# Patient Record
Sex: Female | Born: 1935 | Race: White | Hispanic: No | Marital: Married | Smoking: Never smoker
Health system: Southern US, Community
[De-identification: ages and names within clinical notes are randomized; demographics above are authoritative.]

## PROBLEM LIST (undated history)

## (undated) DIAGNOSIS — K922 Gastrointestinal hemorrhage, unspecified: Secondary | ICD-10-CM

## (undated) DIAGNOSIS — R011 Cardiac murmur, unspecified: Secondary | ICD-10-CM

## (undated) DIAGNOSIS — F0390 Unspecified dementia without behavioral disturbance: Secondary | ICD-10-CM

## (undated) DIAGNOSIS — J189 Pneumonia, unspecified organism: Secondary | ICD-10-CM

## (undated) DIAGNOSIS — I639 Cerebral infarction, unspecified: Secondary | ICD-10-CM

## (undated) DIAGNOSIS — C801 Malignant (primary) neoplasm, unspecified: Secondary | ICD-10-CM

## (undated) DIAGNOSIS — C50919 Malignant neoplasm of unspecified site of unspecified female breast: Secondary | ICD-10-CM

## (undated) HISTORY — PX: ROTATOR CUFF REPAIR: SHX139

## (undated) HISTORY — PX: MASTECTOMY: SHX3

## (undated) HISTORY — PX: OTHER SURGICAL HISTORY: SHX169

---

## 1998-09-07 ENCOUNTER — Other Ambulatory Visit: Admission: RE | Admit: 1998-09-07 | Discharge: 1998-09-07 | Payer: Self-pay | Admitting: Gynecology

## 1999-01-13 ENCOUNTER — Other Ambulatory Visit: Admission: RE | Admit: 1999-01-13 | Discharge: 1999-01-13 | Payer: Self-pay | Admitting: Gynecology

## 1999-02-06 ENCOUNTER — Emergency Department (HOSPITAL_COMMUNITY): Admission: EM | Admit: 1999-02-06 | Discharge: 1999-02-06 | Payer: Self-pay | Admitting: Emergency Medicine

## 1999-03-01 ENCOUNTER — Encounter: Admission: RE | Admit: 1999-03-01 | Discharge: 1999-03-19 | Payer: Self-pay | Admitting: Surgery

## 1999-09-07 ENCOUNTER — Other Ambulatory Visit: Admission: RE | Admit: 1999-09-07 | Discharge: 1999-09-07 | Payer: Self-pay | Admitting: Gynecology

## 1999-10-18 ENCOUNTER — Encounter: Admission: RE | Admit: 1999-10-18 | Discharge: 1999-10-18 | Payer: Self-pay | Admitting: Gynecology

## 1999-10-18 ENCOUNTER — Encounter: Payer: Self-pay | Admitting: Gynecology

## 1999-10-20 ENCOUNTER — Inpatient Hospital Stay (HOSPITAL_COMMUNITY): Admission: RE | Admit: 1999-10-20 | Discharge: 1999-10-22 | Payer: Self-pay | Admitting: Gynecology

## 1999-10-20 ENCOUNTER — Encounter (INDEPENDENT_AMBULATORY_CARE_PROVIDER_SITE_OTHER): Payer: Self-pay | Admitting: Specialist

## 2000-06-30 ENCOUNTER — Ambulatory Visit (HOSPITAL_COMMUNITY): Admission: RE | Admit: 2000-06-30 | Discharge: 2000-06-30 | Payer: Self-pay | Admitting: *Deleted

## 2000-06-30 ENCOUNTER — Encounter: Payer: Self-pay | Admitting: *Deleted

## 2000-07-17 ENCOUNTER — Ambulatory Visit (HOSPITAL_COMMUNITY): Admission: RE | Admit: 2000-07-17 | Discharge: 2000-07-17 | Payer: Self-pay | Admitting: *Deleted

## 2000-07-17 ENCOUNTER — Encounter: Payer: Self-pay | Admitting: *Deleted

## 2000-08-11 ENCOUNTER — Ambulatory Visit (HOSPITAL_COMMUNITY): Admission: RE | Admit: 2000-08-11 | Discharge: 2000-08-11 | Payer: Self-pay | Admitting: *Deleted

## 2000-09-12 ENCOUNTER — Other Ambulatory Visit: Admission: RE | Admit: 2000-09-12 | Discharge: 2000-09-12 | Payer: Self-pay | Admitting: Gynecology

## 2000-09-25 ENCOUNTER — Encounter: Admission: RE | Admit: 2000-09-25 | Discharge: 2000-09-25 | Payer: Self-pay | Admitting: Surgery

## 2000-09-25 ENCOUNTER — Encounter: Payer: Self-pay | Admitting: Surgery

## 2001-10-01 ENCOUNTER — Encounter: Payer: Self-pay | Admitting: Surgery

## 2001-10-01 ENCOUNTER — Encounter: Admission: RE | Admit: 2001-10-01 | Discharge: 2001-10-01 | Payer: Self-pay | Admitting: Surgery

## 2001-10-02 ENCOUNTER — Other Ambulatory Visit: Admission: RE | Admit: 2001-10-02 | Discharge: 2001-10-02 | Payer: Self-pay | Admitting: Gynecology

## 2001-11-23 ENCOUNTER — Inpatient Hospital Stay (HOSPITAL_COMMUNITY): Admission: AD | Admit: 2001-11-23 | Discharge: 2001-11-26 | Payer: Self-pay | Admitting: Internal Medicine

## 2001-12-11 ENCOUNTER — Encounter: Admission: RE | Admit: 2001-12-11 | Discharge: 2001-12-11 | Payer: Self-pay | Admitting: Internal Medicine

## 2001-12-11 ENCOUNTER — Encounter: Payer: Self-pay | Admitting: Internal Medicine

## 2002-10-03 ENCOUNTER — Encounter: Admission: RE | Admit: 2002-10-03 | Discharge: 2002-10-03 | Payer: Self-pay | Admitting: Surgery

## 2002-10-03 ENCOUNTER — Encounter: Payer: Self-pay | Admitting: Surgery

## 2002-10-07 ENCOUNTER — Other Ambulatory Visit: Admission: RE | Admit: 2002-10-07 | Discharge: 2002-10-07 | Payer: Self-pay

## 2002-11-20 ENCOUNTER — Encounter: Admission: RE | Admit: 2002-11-20 | Discharge: 2002-11-20 | Payer: Self-pay | Admitting: Internal Medicine

## 2002-11-20 ENCOUNTER — Encounter: Payer: Self-pay | Admitting: Internal Medicine

## 2002-11-26 ENCOUNTER — Encounter: Payer: Self-pay | Admitting: Internal Medicine

## 2002-11-26 ENCOUNTER — Encounter: Admission: RE | Admit: 2002-11-26 | Discharge: 2002-11-26 | Payer: Self-pay | Admitting: Internal Medicine

## 2003-12-24 ENCOUNTER — Encounter: Admission: RE | Admit: 2003-12-24 | Discharge: 2003-12-24 | Payer: Self-pay | Admitting: Internal Medicine

## 2004-03-01 ENCOUNTER — Encounter: Admission: RE | Admit: 2004-03-01 | Discharge: 2004-03-01 | Payer: Self-pay | Admitting: Surgery

## 2004-08-26 ENCOUNTER — Ambulatory Visit (HOSPITAL_COMMUNITY): Admission: RE | Admit: 2004-08-26 | Discharge: 2004-08-26 | Payer: Self-pay | Admitting: Gastroenterology

## 2004-10-18 ENCOUNTER — Encounter: Admission: RE | Admit: 2004-10-18 | Discharge: 2004-10-18 | Payer: Self-pay | Admitting: Obstetrics and Gynecology

## 2005-05-17 ENCOUNTER — Encounter: Admission: RE | Admit: 2005-05-17 | Discharge: 2005-05-17 | Payer: Self-pay | Admitting: Internal Medicine

## 2005-08-05 ENCOUNTER — Encounter (INDEPENDENT_AMBULATORY_CARE_PROVIDER_SITE_OTHER): Payer: Self-pay | Admitting: Surgery

## 2005-08-05 ENCOUNTER — Ambulatory Visit (HOSPITAL_BASED_OUTPATIENT_CLINIC_OR_DEPARTMENT_OTHER): Admission: RE | Admit: 2005-08-05 | Discharge: 2005-08-05 | Payer: Self-pay | Admitting: Surgery

## 2005-10-05 ENCOUNTER — Ambulatory Visit (HOSPITAL_COMMUNITY): Admission: RE | Admit: 2005-10-05 | Discharge: 2005-10-05 | Payer: Self-pay | Admitting: Internal Medicine

## 2005-10-17 ENCOUNTER — Ambulatory Visit (HOSPITAL_COMMUNITY): Admission: RE | Admit: 2005-10-17 | Discharge: 2005-10-17 | Payer: Self-pay | Admitting: Internal Medicine

## 2005-12-29 ENCOUNTER — Other Ambulatory Visit: Admission: RE | Admit: 2005-12-29 | Discharge: 2005-12-29 | Payer: Self-pay | Admitting: Obstetrics and Gynecology

## 2006-10-09 ENCOUNTER — Encounter: Admission: RE | Admit: 2006-10-09 | Discharge: 2006-10-09 | Payer: Self-pay | Admitting: Orthopedic Surgery

## 2006-10-10 ENCOUNTER — Ambulatory Visit (HOSPITAL_BASED_OUTPATIENT_CLINIC_OR_DEPARTMENT_OTHER): Admission: RE | Admit: 2006-10-10 | Discharge: 2006-10-11 | Payer: Self-pay | Admitting: Orthopedic Surgery

## 2007-03-16 ENCOUNTER — Encounter: Admission: RE | Admit: 2007-03-16 | Discharge: 2007-03-16 | Payer: Self-pay | Admitting: Internal Medicine

## 2007-07-11 ENCOUNTER — Encounter: Admission: RE | Admit: 2007-07-11 | Discharge: 2007-10-09 | Payer: Self-pay | Admitting: Internal Medicine

## 2007-11-30 ENCOUNTER — Encounter: Admission: RE | Admit: 2007-11-30 | Discharge: 2007-11-30 | Payer: Self-pay | Admitting: Internal Medicine

## 2007-12-05 ENCOUNTER — Encounter: Admission: RE | Admit: 2007-12-05 | Discharge: 2007-12-05 | Payer: Self-pay | Admitting: Internal Medicine

## 2008-01-29 ENCOUNTER — Encounter: Admission: RE | Admit: 2008-01-29 | Discharge: 2008-01-29 | Payer: Self-pay | Admitting: Internal Medicine

## 2008-01-31 ENCOUNTER — Encounter: Admission: RE | Admit: 2008-01-31 | Discharge: 2008-01-31 | Payer: Self-pay | Admitting: Internal Medicine

## 2008-02-06 ENCOUNTER — Ambulatory Visit (HOSPITAL_COMMUNITY): Admission: RE | Admit: 2008-02-06 | Discharge: 2008-02-06 | Payer: Self-pay | Admitting: Internal Medicine

## 2008-04-03 ENCOUNTER — Encounter: Admission: RE | Admit: 2008-04-03 | Discharge: 2008-04-03 | Payer: Self-pay | Admitting: Obstetrics and Gynecology

## 2008-08-25 ENCOUNTER — Encounter: Admission: RE | Admit: 2008-08-25 | Discharge: 2008-08-25 | Payer: Self-pay | Admitting: Internal Medicine

## 2008-08-28 ENCOUNTER — Encounter: Admission: RE | Admit: 2008-08-28 | Discharge: 2008-08-28 | Payer: Self-pay | Admitting: Internal Medicine

## 2010-07-23 ENCOUNTER — Encounter: Admission: RE | Admit: 2010-07-23 | Discharge: 2010-07-23 | Payer: Self-pay | Admitting: Internal Medicine

## 2010-07-27 ENCOUNTER — Encounter: Admission: RE | Admit: 2010-07-27 | Discharge: 2010-07-27 | Payer: Self-pay | Admitting: Internal Medicine

## 2010-07-29 ENCOUNTER — Encounter: Admission: RE | Admit: 2010-07-29 | Discharge: 2010-07-29 | Payer: Self-pay | Admitting: Internal Medicine

## 2010-12-25 ENCOUNTER — Encounter: Payer: Self-pay | Admitting: Internal Medicine

## 2010-12-26 ENCOUNTER — Encounter: Payer: Self-pay | Admitting: Surgery

## 2011-04-22 NOTE — Op Note (Signed)
[name], LADD NO.:  [id number]   MEDICAL RECORD NO.:  [id number]          PATIENT TYPE:  AMB   LOCATION:  DSC                          FACILITY:  [facility]   PHYSICIAN:  Currie Paris, M.D.DATE OF BIRTH:  06/24/1928   DATE OF PROCEDURE:  08/05/2005  DATE OF DISCHARGE:                                 OPERATIVE REPORT   PREOPERATIVE DIAGNOSIS:  Skin lesion of left anterior chest Scarpino, possibly  basal cell carcinoma.   POSTOPERATIVE DIAGNOSIS:  Skin lesion of left anterior chest Buske, possibly  basal cell carcinoma.   OPERATION:  Excision.   SURGEON:  Currie Paris, M.D.   ANESTHESIA:  Local.   CLINICAL HISTORY:  This patient has an enlarging basaloid-looking lesion of  the anterior chest Vancuren that measures about a centimeter.  After discussion  with the patient, she elected to have this removed.   DESCRIPTION OF PROCEDURE:  In the minor procedure room, the patient  confirmed the plans and the lesion was identified.  A time-out occurred.  The area was prepped with some alcohol and then injected with about 8 mL of  1% Xylocaine with epinephrine.  I waited about 10 minutes for the  epinephrine to soak in and then prepped the area Betadine.  The area was  excised with a 3-cm-long incision.  This got it out with what appeared to be  grossly a margin.   The incision was then closed in layers with 3-0 Vicryl followed by 4-0  Monocryl subcuticular and Dermabond.  The patient tolerated her procedure  well.  There were no complications.      Currie Paris, M.D.  Electronically Signed     CJS/MEDQ  D:  08/05/2005  T:  08/06/2005  Job:  [id number]

## 2011-04-22 NOTE — Cardiovascular Report (Signed)
[facility]  Patient:    [name], [name] Visit Number: [id number] MRN: [id number]          Service Type: MED Location: [phone] Attending Physician:  Armanda Magic Dictated by:   Armanda Magic, M.D. Proc. Date: 11/26/01 Admit Date:  11/23/2001 Discharge Date: 11/26/2001   CC:         Theressa Millard, M.D.   Cardiac Catheterization  REFERRING PHYSICIAN: Theressa Millard, M.D.  PROCEDURES PERFORMED: Left heart catheterization, coronary angiography, left ventriculography.  INDICATIONS: Chest pain.  COMPLICATIONS: None.  INTERVENOUS ACCESS: Via right femoral artery, 6 French sheath.  MEDICATIONS GIVEN: Versed 1 mg IV.  HISTORY OF PRESENT ILLNESS: This is a 77 year old, white female with no previous catheter history, who presents with symptoms of unstable angina and now presents for cardiac catheterization.  DESCRIPTION OF PROCEDURE: The patient is brought to the cardiac catheterization laboratory in a fasting, nonsedated state.  Informed consent was obtained.  The patient was connected to continuous heart rate and pulse oximetry monitoring, and intermittent blood pressure monitoring. The right groin was prepped and draped in a sterile fashion.  Xylocaine 1% was used for local anesthesia.  Using the modified Seldinger technique, a 6 French sheath was placed in the right femoral artery.  Under fluoroscopic guidance, a 6 [place] JL4 catheter was placed into the left coronary artery. Multiple cine films were taken in 30 degree RAO, 40 degree LAO views.  This catheter was then exchanged out over a guide wire for a 6 [place] JR4 catheter, which was unsuccessful in engaging the right coronary ostium. This catheter was then exchanged out for a noto right coronary catheter, again, which was unsuccessful in engaging the right coronary artery. This catheter was then exchanged out and an 6 French angled pigtail catheter was placed under fluoroscopic  guidance in the left ventricular cavity. Left ventriculography was performed in a 30 degree RAO view using a total of 24 cc of contrast at 12 cc/sec. The catheter was then pulled back across the aortic valve with no significant pressure gradient. The catheter was then removed over a guide wire and a 6 French 3.5 JR4 was again unsuccessful in engaging the right coronary ostium. This catheter was then exchanged out over a guide wire for a 5 [place] Williams right coronary catheter, which was flushed with the coronary ostium but not fully engaged but was able to completely fill the right coronary artery. Multiple cine films were taken in a 30 degree RAO, 40 degree LAO views. This catheter was then removed over a guide wire. At the end of the procedure, all catheters and sheaths were removed. Manual compression was performed until adequate hemostasis was obtained. The patient was transferred back to the room in stable condition.  IMPRESSION: 1. The left main coronary artery is widely patent and trifurcates into a    left anterior descending artery, ramus branch, and left circumflex artery. 2. The left anterior descending artery is widely patent throughout its    course and gives rise to one diagonal branch which is widely patent. 3. The ramus is a small blood vessel but widely patent. 4. The left circumflex is widely patent throughout its course. 5. The right coronary artery is widely patent throughout its course and    bifurcates distally in the posterior descending artery and posterolateral    artery. 6. Left ventriculography performed in a 30 degree RAO view using a total    of 24 cc of contrast at  12 cc/sec. showed normal LV function and mild    MR.  Aortic pressure 158/73 mmHg, LV pressure 158/19 mmHg.  ASSESSMENT: 1. Noncardiac chest pain. 2. Normal coronary arteries. 3. Normal left ventricular function with mild mitral regurgitation.  PLAN: 1. A 2-D echocardiogram to rule out  effusion. 2. Discharge home later today after IV fluids and bed rest have been    achieved. 3. Questionable GI work-up as an outpatient by Dr. Benjaman Kindler. Dictated by:   Armanda Magic, M.D. Attending Physician:  Armanda Magic DD:  11/26/01 TD:  11/26/01 Job: [id number]

## 2011-04-22 NOTE — Op Note (Signed)
NAMEFINNLEE, GUARNIERI NO.:  [id number]   MEDICAL RECORD NO.:  [id number]          PATIENT TYPE:  AMB   LOCATION:  DSC                          FACILITY:  [facility]   PHYSICIAN:  Katy Fitch. Sypher, M.D. DATE OF BIRTH:  1928/07/19   DATE OF PROCEDURE:  10/10/2006  DATE OF DISCHARGE:                                 OPERATIVE REPORT   PREOPERATIVE DIAGNOSES:  Chronic stage III impingement, right shoulder, with  unfavorable acromial anatomy and chronic retracted rotator cuff tear  involving the supraspinatus rotator cuff tendon.   POSTOPERATIVE DIAGNOSES:  Degenerative fray of labrum and extensive  degenerative tendinopathy of rotator cuff with adhesive capsulitis and  retracted supraspinatus rotator cuff tear.   OPERATION:  1. Diagnostic arthroscopy of right glenohumeral joint, followed by      debridement of granulation tissue and labral debris.  2. Arthroscopic subacromial decompression with relaxation of      coracoacromial ligament and acromioplasty with debridement of rotator      cuff.  3. Open reconstruction of supraspinatus rotator cuff tear and additional      open inferior acromioplasty.   OPERATIONS:  Katy Fitch. Sypher, M.D.   ASSISTANT:  Molly Maduro Dasnoit PA-C.   ANESTHESIA:  General endotracheal without supplemental interscalene block,  supervising anesthesiologist is Dr. Krista Blue.   INDICATIONS:  Rashel Okeefe is a 77 year old right-hand dominant woman, who is  married to a former patient.  Mr. Emrich has had bilateral shoulder surgery.  Mrs. Haslip presented at the suggestion of Dr. Earl Gala for evaluation and  management of her painful right shoulder.  Clinical examination revealed  signs of clinical impingement.  Plain films revealed reactive bone formation  at the greater tuberosity and unfavorable acromial anatomy suggesting  chronic stage III impingement, and an MRI of the shoulder, despite motion  degradation, revealed evidence of a retracted  supraspinatus rotator cuff  tear.   We advised Ms. Ervine to proceed with arthroscopic evaluation of her shoulder  at this time.  We were anticipating repair of the rotator cuff and  subacromial decompression.   PROCEDURE:  Tiani Stanbery was brought to the operating room and placed in  supine position upon the operating table.   Following the induction of general anesthesia by endotracheal technique, Ms.  Nolden was carefully positioned in the beach-chair position with a of a torso  and head holder designed for shoulder arthroscopy.  PAS compression hose  were applied to the lower extremities for deep vein thrombosis prophylaxis  and the entire right upper extremity and forequarter were prepped with  DuraPrep and draped with impervious arthroscopy drapes.  Examination of the  right shoulder under anesthesia revealed mild adhesive capsulitis with  elevation to 160, external rotation of 75, internal rotation of  approximately 60.   The arthroscope was introduced through a standard posterior viewing portal  with blunt technique.  Diagnostic arthroscopy revealed abundant granulation  tissue within the joint and considerable rotator cuff degenerative changes  involving the supraspinatus and anterior infraspinatus.   The labrum was degenerative from approximately 2 o'clock anteriorly to 11  o'clock posteriorly.  The biceps anchor was sound.  The biceps tendon was  partially degenerative but intact through the rotator interval.   An anterior portal was created under direct vision with a blunt trocar,  followed by introduction of a 4.5-mm suction shaver.  The granulation  tissue, adhesive capsulitis, scarring, and degenerative portions of the  labrum and rotator cuff were debrided under direct vision.  Hemostasis was  achieved with the bipolar cautery device.  After thorough debridement of the  joint interior, the scope was removed from the glenohumeral joint and placed  in the subacromial  space.  There was a florid bursitis noted.  After  extensive bursectomy, the anatomy of the coracoacromial arch was studied.   Despite the MRI suggestion of arthritis at the [facility] joint, there was not a  prominent osteophyte at the [facility] joint; therefore, I elected to leave the [facility]  joint intact.  The acromion was leveled to a type 1 morphology except for a  thick osteophyte anterior laterally that was preserved so as to allow proper  reconstruction of the deltoid.  The rotator cuff tear was documented a  digital camera, followed by removal of the arthroscope.  Hemostasis was  achieved with the bipolar cautery.   We proceeded to an open reconstruction of the rotator cuff tear.  A 5 cm  incision was fashioned from the central acromion laterally.  The middle  third of the deltoid was split more posterior to her usual interval to allow  better visualization of the posterior portion of the rotator cuff tear.  After proper bursectomy, the margins of the cuff tear were identified and  the anatomy of the acromial arch studied.  There was a rather prominent  anterior lip with ossification of the coracoacromial ligament.   A rongeur and oscillating saw were used to level the acromion to a type 2  morphology.  Care was taken to preserve the insertion of the coracoacromial  ligament remnant so as to prevent anterior escape.   The rotator cuff was gathered with a grasping suture of #2 FiberWire that  was placed with Merlinda Frederick through-bone tunnels, followed by placement of a  biocorkscrew at the medial footprint.  The profile of the greater tuberosity  was lowered 3 mm and decorticated a distance of 3 cm from anterior to  posterior and approximately 18 mm from lateral to medial.  After placement  of the biocorkscrew anchor, two mattress sutures were placed in the  supraspinatus to allow a good medial footprint.  The McLaughlin sutures were tensioned, advancing the cuff tear laterally,  followed by tying  of both of the biocorkscrew mattress sutures.  The tails  of the mattress sutures were then crisscrossed and placed with push locks  laterally into the humeral cortex with an over-the-top belay technique,  securing and insetting the repair.  A 0 Vicryl was then used to smooth the  repair posteriorly.   A very sensory low-profile well-inset repair was achieved.   The posterior lip of the acromion was leveled with a hand rasp, followed by  irrigation of the subacromial space with sterile saline.  After hemostasis,  the deltoid was repaired anatomically with mattress sutures of #2 FiberWire  using the capsule of the [facility] joint for good anchorage.   An anatomic repair of the deltoid was achieved, followed by repair of the  skin with subdermal sutures of 2-0 Vicryl and intradermal 3-0 Prolene.  The  wound and subacromial space were infiltrated with 0.25% Marcaine and  epinephrine.  There no apparent complications.  Mrs. Larsen tolerated the  surgery and anesthesia well.   She will be transferred to the recovery room and later admitted to the  recovery care center for observation of her vital signs.   During this procedure she was slightly hypertensive.  This was controlled by  the anesthesia staff with appropriate medication.  We had less than 50 mL of  blood loss.  There were no complications noted intraoperatively.      Katy Fitch Sypher, M.D.  Electronically Signed     RVS/MEDQ  D:  10/10/2006  T:  10/10/2006  Job:  [id number]   cc:   Theressa Millard, M.D.

## 2011-04-22 NOTE — Op Note (Signed)
NAMEVERNON, [name] NO.:  [id number]   MEDICAL RECORD NO.:  [id number]          PATIENT TYPE:  AMB   LOCATION:  ENDO                         FACILITY:  [facility]   PHYSICIAN:  Danise Edge, M.D.   DATE OF BIRTH:  1927-12-29   DATE OF PROCEDURE:  08/26/2004  DATE OF DISCHARGE:                                 OPERATIVE REPORT   PROCEDURE:  Colonoscopy.   PROCEDURAL INDICATIONS:  [name] is a 77 year old female born  08/06/1928.  [name] is scheduled to undergo her first screening  colonoscopy with polypectomy to prevent colon cancer.  She has noticed a  change in her bowel movement pattern.  Her stool caliber is decreased and  her stool frequency decreased.  There is no personal or family history of  colon cancer.   ENDOSCOPIST:  Danise Edge, M.D.   PREMEDICATION:  Versed 5 mg, Demerol 50 mg.   PROCEDURE:  After obtaining informed consent, Ms. Cermak was placed in the  left lateral decubitus position.  I administered intravenous Demerol and  intravenous Versed to achieve conscious sedation for the procedure.  The  patient's blood pressure, oxygen saturation, and cardiac rhythm were  monitored throughout the procedure and documented in the medical record.   Anal inspection and digital rectal exam was normal.  The Olympus adjustable  pediatric colonoscope was introduced into the rectum and advanced to the  cecum.  Colonic preparation for the exam today was excellent.   RECTUM:  Normal.   SIGMOID COLON/DESCENDING COLON:  With colonic diverticulosis.   SPLENIC FLEXURE:  Normal.   TRANSVERSE COLON:  Normal.   HEPATIC FLEXURE:  Normal.   ASCENDING COLON:  Normal.   CECUM AND ILEOCECAL VALVE:  Normal.   ASSESSMENT:  Normal screening proctocolonoscopy of the cecum.  Left colonic  diverticulosis noted.  No endoscopic evidence for the presence of colorectal  neoplasia.      MJ/MEDQ  D:  08/26/2004  T:  08/26/2004  Job:  [id number]   cc:    [name], M.D.  [address] [place]  [place] [address]  Fax: [phone]

## 2011-04-22 NOTE — Discharge Summary (Signed)
[facility]  Patient:    [name], [name] Visit Number: [id number] MRN: [id number]          Service Type: MED Location: [phone] Attending Physician:  Armanda Magic Dictated by:   Anselm Lis, N.P. Admit Date:  11/23/2001 Discharge Date: 11/26/2001                             Discharge Summary  PRIMARY CARE Adahlia [place]em[place]:  Theressa Millard, M.D.  PROCEDURES: 1. (November 26, 2001) coronary angiography revealing normal left ventricular    size and function with mild mitral regurgitation.  Normal coronary    arteries. 2. (November 26, 2001) 2-D echocardiogram revealing normal left ventricular    size and function with ejection fraction of 55-65%.  No regional Simonet    motion abnormalities.  Moderate asymmetric septal hypertrophy.  No    pericardial effusion.  DISCHARGE DIAGNOSIS:  Chest pain, noncardiac in etiology; normal left ventricular size and function by 2-D echocardiogram and coronary angiography. Normal coronary arteries by coronary angiogram; questionable gastrointestinal workup in future by Dr. Earl Gala as outpatient.  PLAN: DISPOSITION:  Patient was discharged home in stable condition.  DISCHARGE MEDICATIONS: 1. (New) Nexium 40 mg p.o. q.d. (or Protonix per patients insurance). 2. Vioxx. 3. Vitamins C and E. 4. Miacalcin 1 spray once daily alternating ears.  ACTIVITIES:  No heavy lifting or pushing, or driving day of discharge or the day after discharge, then activity as before.  WOUND CARE:  May shower.  SPECIAL INSTRUCTIONS:  Patient is to call our clinic if she develops a large amount of swelling or bruising in the groin area.  FOLLOW-UP:  Dr. Theressa Millard Monday, January 06th, at 2:45 PM. Dr. Armanda Magic Friday, January 10th, at 3:45 PM.  HISTORY OF PRESENT ILLNESS:  [age]-year-old female admitted with complaints of chest tightness and DOE.  Atypical in presentation.  HOSPITAL COURSE:  1. Initiated on IV heparin  and nitrites.  She ruled out for MI by negative serial cardiac enzymes and nonischemic EKG.  Fairly good lipid profile with HDL 69 and LDL of 140.  On December 23 she went for coronary angiography revealing widely patent coronary arteries and normal left ventricular size and function.  Follow up 2-D echo was negative for pericardial effusion.  She was discharged home with plans for outpatient GI workup by Dr. Earl Gala. Protonix was added to her regimen.  2. Hypokalemia:  Serum potassium 2.2; supplemented and follow up K of 3.6.  LABORATORY TESTS/DATA:  WBC 6, hemoglobin 13.6, hematocrit of 39.1 and platelets of 238,000.  Coags were within normal range.  Sodium 141, K 2.2, which was supplemented, follow up K 3.6, chloride of 106, CO2 28, glucose of 90, BUN of 15, and creatinine 0.8.  LFTs within normal range.  Serial cardiac enzymes times three (CK/MB and troponin I) were negative.  Fasting lipid profile:  cholesterol of 218, triglycerides 47, HDL of 69 and LDL of 140.  PAST MEDICAL HISTORY: 1. Ductal carcinoma in situ of breast:    a. (1999) mastectomy, left. 2. Hysterectomy in 2000 and Dr. Nicholas Lose restarted Estrace in 2000.  This was    discontinued by Dr. Earl Gala, earlier in 2002. 3. Arthritis, left hip; prior CT of the LS spine and bone scan revealed    spondylosis, question spinal stenosis; on Vioxx and Advil. 4. Osteopenia status post bone density scan October 2000; on miacalcin. Dictated by:  Anselm Lis, N.P. Attending Physician:  Armanda Magic DD:  12/31/01 TD:  01/01/02 Job: [id number]

## 2011-07-13 ENCOUNTER — Other Ambulatory Visit: Payer: Self-pay | Admitting: Internal Medicine

## 2011-07-13 ENCOUNTER — Ambulatory Visit
Admission: RE | Admit: 2011-07-13 | Discharge: 2011-07-13 | Disposition: A | Discharge status: Home / Self Care | Payer: Medicare Other | Source: Ambulatory Visit | Attending: Internal Medicine | Admitting: Internal Medicine

## 2011-07-13 DIAGNOSIS — R609 Edema, unspecified: Secondary | ICD-10-CM

## 2011-07-29 ENCOUNTER — Other Ambulatory Visit: Payer: Self-pay | Admitting: Internal Medicine

## 2011-07-29 DIAGNOSIS — Z1231 Encounter for screening mammogram for malignant neoplasm of breast: Secondary | ICD-10-CM

## 2011-08-05 ENCOUNTER — Other Ambulatory Visit: Payer: Self-pay | Admitting: Internal Medicine

## 2011-08-05 ENCOUNTER — Ambulatory Visit
Admission: RE | Admit: 2011-08-05 | Discharge: 2011-08-05 | Disposition: A | Discharge status: Home / Self Care | Payer: Medicare Other | Source: Ambulatory Visit | Attending: Internal Medicine | Admitting: Internal Medicine

## 2011-08-05 DIAGNOSIS — Z1231 Encounter for screening mammogram for malignant neoplasm of breast: Secondary | ICD-10-CM

## 2012-08-23 ENCOUNTER — Other Ambulatory Visit: Payer: Self-pay | Admitting: Internal Medicine

## 2012-08-23 DIAGNOSIS — Z9011 Acquired absence of right breast and nipple: Secondary | ICD-10-CM

## 2012-08-23 DIAGNOSIS — Z1231 Encounter for screening mammogram for malignant neoplasm of breast: Secondary | ICD-10-CM

## 2012-10-05 ENCOUNTER — Ambulatory Visit
Admission: RE | Admit: 2012-10-05 | Discharge: 2012-10-05 | Disposition: A | Discharge status: Home / Self Care | Payer: Medicare Other | Source: Ambulatory Visit | Attending: Internal Medicine | Admitting: Internal Medicine

## 2012-10-05 DIAGNOSIS — Z1231 Encounter for screening mammogram for malignant neoplasm of breast: Secondary | ICD-10-CM

## 2012-10-05 DIAGNOSIS — Z9011 Acquired absence of right breast and nipple: Secondary | ICD-10-CM

## 2013-08-19 ENCOUNTER — Other Ambulatory Visit: Payer: Self-pay

## 2013-08-19 DIAGNOSIS — Z9011 Acquired absence of right breast and nipple: Secondary | ICD-10-CM

## 2013-08-19 DIAGNOSIS — Z853 Personal history of malignant neoplasm of breast: Secondary | ICD-10-CM

## 2013-08-19 DIAGNOSIS — Z1231 Encounter for screening mammogram for malignant neoplasm of breast: Secondary | ICD-10-CM

## 2013-10-08 ENCOUNTER — Ambulatory Visit: Payer: Medicare Other

## 2013-10-21 ENCOUNTER — Ambulatory Visit
Admission: RE | Admit: 2013-10-21 | Discharge: 2013-10-21 | Disposition: A | Discharge status: Home / Self Care | Payer: Medicare Other | Source: Ambulatory Visit

## 2013-10-21 DIAGNOSIS — Z853 Personal history of malignant neoplasm of breast: Secondary | ICD-10-CM

## 2013-10-21 DIAGNOSIS — Z9011 Acquired absence of right breast and nipple: Secondary | ICD-10-CM

## 2013-10-21 DIAGNOSIS — Z1231 Encounter for screening mammogram for malignant neoplasm of breast: Secondary | ICD-10-CM

## 2014-10-09 ENCOUNTER — Other Ambulatory Visit: Payer: Self-pay

## 2014-10-09 DIAGNOSIS — Z9011 Acquired absence of right breast and nipple: Secondary | ICD-10-CM

## 2014-10-09 DIAGNOSIS — Z1231 Encounter for screening mammogram for malignant neoplasm of breast: Secondary | ICD-10-CM

## 2014-11-05 ENCOUNTER — Ambulatory Visit
Admission: RE | Admit: 2014-11-05 | Discharge: 2014-11-05 | Disposition: A | Discharge status: Home / Self Care | Payer: Medicare Other | Source: Ambulatory Visit

## 2014-11-05 ENCOUNTER — Encounter (INDEPENDENT_AMBULATORY_CARE_PROVIDER_SITE_OTHER): Payer: Self-pay

## 2014-11-05 DIAGNOSIS — Z9011 Acquired absence of right breast and nipple: Secondary | ICD-10-CM

## 2014-11-05 DIAGNOSIS — Z1231 Encounter for screening mammogram for malignant neoplasm of breast: Secondary | ICD-10-CM

## 2014-12-19 ENCOUNTER — Other Ambulatory Visit: Payer: Self-pay | Admitting: Nurse Practitioner

## 2014-12-19 ENCOUNTER — Ambulatory Visit
Admission: RE | Admit: 2014-12-19 | Discharge: 2014-12-19 | Disposition: A | Discharge status: Home / Self Care | Payer: Medicare Other | Source: Ambulatory Visit | Attending: Nurse Practitioner | Admitting: Nurse Practitioner

## 2014-12-19 DIAGNOSIS — R0789 Other chest pain: Secondary | ICD-10-CM

## 2015-03-05 ENCOUNTER — Emergency Department
Admit: 2015-03-05 | Discharge: 2015-03-05 | Disposition: A | Discharge status: Home / Self Care | Payer: Self-pay | Admitting: Emergency Medicine

## 2015-03-05 LAB — COMPREHENSIVE METABOLIC PANEL
ANION GAP: 9 (ref 7–16)
AST: 19 U/L
Albumin: 4.2 g/dL
Alkaline Phosphatase: 83 U/L
BUN: 26 mg/dL — AB
Bilirubin,Total: 0.4 mg/dL
CHLORIDE: 102 mmol/L
CO2: 23 mmol/L
CREATININE: 1.35 mg/dL — AB
Calcium, Total: 8.8 mg/dL — ABNORMAL LOW
EGFR (African American): 42 — ABNORMAL LOW
EGFR (Non-African Amer.): 36 — ABNORMAL LOW
Glucose: 121 mg/dL — ABNORMAL HIGH
Potassium: 3.4 mmol/L — ABNORMAL LOW
SGPT (ALT): 13 U/L — ABNORMAL LOW
SODIUM: 134 mmol/L — AB
Total Protein: 8 g/dL

## 2015-03-05 LAB — URINALYSIS, COMPLETE
BILIRUBIN, UR: NEGATIVE
BLOOD: NEGATIVE
Bacteria: NONE SEEN
GLUCOSE, UR: NEGATIVE mg/dL (ref 0–75)
Granular Cast: 3
KETONE: NEGATIVE
LEUKOCYTE ESTERASE: NEGATIVE
Nitrite: NEGATIVE
Ph: 5 (ref 4.5–8.0)
Protein: 30
RBC, UR: NONE SEEN /HPF (ref 0–5)
Specific Gravity: 1.024 (ref 1.003–1.030)
Squamous Epithelial: 1
WBC UR: NONE SEEN /HPF (ref 0–5)

## 2015-03-05 LAB — CBC
HCT: 41.5 % (ref 35.0–47.0)
HGB: 13.8 g/dL (ref 12.0–16.0)
MCH: 29.9 pg (ref 26.0–34.0)
MCHC: 33.2 g/dL (ref 32.0–36.0)
MCV: 90 fL (ref 80–100)
PLATELETS: 153 10*3/uL (ref 150–440)
RBC: 4.61 10*6/uL (ref 3.80–5.20)
RDW: 14 % (ref 11.5–14.5)
WBC: 5.5 10*3/uL (ref 3.6–11.0)

## 2015-03-05 LAB — PROTIME-INR
INR: 1.1
Prothrombin Time: 14 secs

## 2015-03-05 LAB — LIPASE, BLOOD: Lipase: 26 U/L

## 2015-03-05 LAB — APTT: Activated PTT: 33 secs (ref 23.6–35.9)

## 2015-03-05 LAB — CK TOTAL AND CKMB (NOT AT ARMC)
CK, Total: 92 U/L
CK-MB: 2.1 ng/mL

## 2015-03-05 LAB — TROPONIN I: Troponin-I: <0.03 ng/mL

## 2015-03-10 LAB — CULTURE, BLOOD (SINGLE)

## 2015-03-19 LAB — URINALYSIS, COMPLETE
Bilirubin,UR: NEGATIVE
Glucose,UR: NEGATIVE mg/dL (ref 0–75)
KETONE: NEGATIVE
Nitrite: POSITIVE
PH: 5 (ref 4.5–8.0)
Specific Gravity: 1.023 (ref 1.003–1.030)

## 2015-03-19 LAB — TROPONIN I

## 2015-03-19 LAB — CBC
HCT: 42.6 % (ref 35.0–47.0)
HGB: 13.9 g/dL (ref 12.0–16.0)
MCH: 29.8 pg (ref 26.0–34.0)
MCHC: 32.6 g/dL (ref 32.0–36.0)
MCV: 91 fL (ref 80–100)
PLATELETS: 219 10*3/uL (ref 150–440)
RBC: 4.67 10*6/uL (ref 3.80–5.20)
RDW: 13.9 % (ref 11.5–14.5)
WBC: 8.3 10*3/uL (ref 3.6–11.0)

## 2015-03-19 LAB — COMPREHENSIVE METABOLIC PANEL
ALT: 10 U/L — AB
AST: 12 U/L — AB
Albumin: 4.1 g/dL
Alkaline Phosphatase: 87 U/L
Anion Gap: 8 (ref 7–16)
BUN: 24 mg/dL — AB
Bilirubin,Total: 0.5 mg/dL
CHLORIDE: 106 mmol/L
Calcium, Total: 9.4 mg/dL
Co2: 28 mmol/L
Creatinine: 1.12 mg/dL — ABNORMAL HIGH
EGFR (African American): 52 — ABNORMAL LOW
EGFR (Non-African Amer.): 45 — ABNORMAL LOW
GLUCOSE: 129 mg/dL — AB
Potassium: 3.6 mmol/L
SODIUM: 142 mmol/L
TOTAL PROTEIN: 8 g/dL

## 2015-03-20 ENCOUNTER — Observation Stay
Admit: 2015-03-20 | Disposition: A | Discharge status: Skilled Nursing Facility | Payer: Self-pay | Attending: Internal Medicine | Admitting: Internal Medicine

## 2015-03-22 LAB — CBC WITH DIFFERENTIAL/PLATELET
BASOS PCT: 0.7 %
Basophil #: 0 10*3/uL (ref 0.0–0.1)
EOS ABS: 0 10*3/uL (ref 0.0–0.7)
Eosinophil %: 0.2 %
HCT: 36.3 % (ref 35.0–47.0)
HGB: 12.4 g/dL (ref 12.0–16.0)
Lymphocyte #: 3.3 10*3/uL (ref 1.0–3.6)
Lymphocyte %: 47 %
MCH: 30.5 pg (ref 26.0–34.0)
MCHC: 34.1 g/dL (ref 32.0–36.0)
MCV: 89 fL (ref 80–100)
MONO ABS: 1.4 x10 3/mm — AB (ref 0.2–0.9)
Monocyte %: 19.3 %
NEUTROS ABS: 2.3 10*3/uL (ref 1.4–6.5)
Neutrophil %: 32.8 %
Platelet: 186 10*3/uL (ref 150–440)
RBC: 4.07 10*6/uL (ref 3.80–5.20)
RDW: 13.6 % (ref 11.5–14.5)
WBC: 7.1 10*3/uL (ref 3.6–11.0)

## 2015-03-22 LAB — URINE CULTURE

## 2015-04-05 NOTE — Discharge Summary (Addendum)
PATIENT NAME:  [name], [name] MR#:  [id number] DATE OF BIRTH:  11-08-1928  DATE OF ADMISSION:  03/20/2015 DATE OF DISCHARGE:  03/23/2015   DISPOSITION:  [place] on the medical unit.   DISCHARGE DIAGNOSES:  1.  Urinary tract infection with encephalopathy.   2.  Escherichia coli urinary tract infection. 3.  Severe Alzheimer dementia.  4.  Hypertension.  5.  Constipation.    DISCHARGE MEDICATIONS:  Aricept 10 mg p.o. daily,  fluticasone nasal spray 50 mcg once a day, Namenda XR 28 mg daily, trazodone 50 mg daily, cetirizine 10 mg daily, Zoloft 100 mg p.o. daily, metoprolol tartrate 25 mg p.o. b.i.d., Seroquel 200 mg p.o. b.i.d., Omnicef 300 mg q. 12 hours  for 10 days.   CONSULTATIONS:  None.   HOSPITAL COURSE:  An 81 year old female patient with dementia, hypertension who comes in because of more confusion than baseline. Look at history and physical for full details. Patient unable to give any history.  1.  The patient was found to have significant UTI on admission. She was afebrile on admission and UA showed 3+ leukocyte esterase, nitrites, and too numerous WBCs, and bacteria on admission. She was started on IV Rocephin. Urine culture showed Escherichia coli sensitive to cefoxitin, but resistant to Levaquin, Cipro, and ampicillin, so we discharged her with [facility] for 10 days.  2.  Severe Alzheimer dementia (;Continue her medications.  3.  Constipation. Abdominal x-ray showed only constipation, but no obstruction, and she is on senna and Colace.  4.  Depression. Continue Zoloft.  5.  Hypertension. Blood pressure is controlled.  She is on metoprolol 25 mg p.o. b.i.d.  6.  Severe dementia. Continue Namenda and Aricept.   DISCHARGE PHYSICAL EXAMINATION:   VITAL SIGNS: Temperature 98.1, heart rate 65, blood pressure 120/68, saturations 95% on room air.  GENERAL: The patient is severely demented, unable to give any complaints.  CARDIOVASCULAR: S1, S2 regular. LUNGS: Clear to auscultation.   ABDOMEN: Soft, nontender, nondistended. Bowel sounds present.    TIME SPENT ON DISCHARGE PREPARATION: More than 30 minutes.     ____________________________ Epifanio Lesches, MD sk:[id number] D: 03/23/2015 12:43:00 ET T: 03/23/2015 16:45:08 ET JOB#: [id number]  cc: Epifanio Lesches, MD, <Dictator> Epifanio Lesches MD ELECTRONICALLY SIGNED 04/07/2015 13:06

## 2015-04-05 NOTE — H&P (Signed)
PATIENT NAME:  [name], [name] MR#:  [id number] DATE OF BIRTH:  1928-10-10  DATE OF ADMISSION:  03/20/2015  The patient was examined before midnight of 03/20/2015.   REFERRING PHYSICIAN:  Lennette Bihari A. Paduchowski, MD  PRIMARY CARE PRACTITIONER:  Dorian Heckle, MD, of [facility]   CHIEF COMPLAINT:  Found to have increased confusion at assisted living place since the afternoon.   HISTORY OF PRESENT ILLNESS:  This is an 81 year old Caucasian female with a history of dementia, hypertension, mitral valve prolapse, breast cancer status post mastectomy in the past, depression, and seasonal allergies. She was brought in by EMS from the assisted living place with the complaints of being found to have increased confusion since yesterday afternoon. According to the patient's daughters, who are with the patient at this time, the patient was noted to have increased confusion and has been having some gait instability and falls at the assisted living place, hence was brought to the Emergency Room for further evaluation. The patient has advanced dementia and has been a resident of assisted living place for the past many years and is a total care dependent. She usually ambulates with the help of a walker at the assisted living place. Because of her dementia, the patient is not able to give any history. She is alert, awake, and following commands, but not able to participate any communication.   In the Emergency Room, the patient was evaluated by the ED physician and was found to be with stable vitals and afebrile. She had a workup, and blood work was unremarkable for any abnormality. Urinalysis was significant for a significant urinary tract infection. Urine was sent for culture and sensitivity, and the patient was started on IV ceftriaxone. The hospitalist service was consulted for further evaluation and management. The patient at the current time is comfortably resting in the bed, and she states she is doing fine and denies  any pain anywhere.   PAST MEDICAL HISTORY:  According to the patient's daughter:  1.  History of advanced dementia.  2.  Hypertension.  3.  Mitral valve prolapse.  4.  Breast cancer status post right mastectomy in the remote past.  5.  Depression.  6.  Seasonal allergies.   PAST SURGICAL HISTORY: 1.  Right mastectomy.  2.  Hysterectomy.  3.  Bladder tuck surgery.  4.  Right rotator cuff surgery.   ALLERGIES:  SULFA.   SOCIAL HISTORY:  She is married and a resident of [facility]. She walks with the help of a walker. She is total care dependent. Denies any alcohol, tobacco, or substance abuse.   FAMILY HISTORY:  Mother with history of colon cancer and dementia. Father died of a heart attack. One sister with leukemia and breast cancer, another sister with heart disease and pulmonary fibrosis, and a brother with pancreatic cancer and heart disease.   HOME MEDICATIONS:  1.  Cetirizine 10 mg tablet 1 tablet orally once a day as needed.  2.  Donepezil 10 mg 1 tablet orally once a day.  3.  Fluticasone inhalation powder 1 puff inhalation once a day.  4.  Metoprolol tartrate 25 mg 1 tablet orally 2 times a day.  5.  Namenda XR 28 mg oral capsule 1 capsule orally once a day.  6.  Quetiapine 200 mg oral tablet 1 tablet orally 2 times a day as needed for agitation.  7.  Quetiapine 200 mg oral tablet 1 tablet orally 2 times a day.  8.  Sertraline 100  mg tablet 1 tablet orally once a day.  9.  Trazodone 50 mg 1 tablet orally at bedtime.    REVIEW OF SYSTEMS:  Unable to obtain from the patient since the patient has advanced dementia. According to the patient's daughter, there is no history of any fever or chills. She was found to have confusion at the nursing home, hence sent to the Emergency Room for evaluation.   PHYSICAL EXAMINATION: VITAL SIGNS:  Temperature 97.7 degrees Fahrenheit, pulse rate 71 per minute, respirations 18 per minute, blood pressure 125/66, oxygen  saturation 97% on room air.  GENERAL:  Well developed, well nourished. She is awake and alert. No acute distress, comfortably resting in the bed and able to follow commands.  HEAD:  Atraumatic, normocephalic.  EYES:  Pupils are equal and reactive to light and accommodation. No conjunctival pallor. No icterus. Extraocular movements are intact.  NOSE:  No drainage.  EARS:  No drainage.  ORAL CAVITY:  No mucosal lesions. No exudates.  NECK:  Supple. No JVD. No thyromegaly. No carotid bruit. Range of motion of the neck is within normal limits.   RESPIRATORY:  Good respiratory effort. Not using accessory muscles of respiration. Bilateral vesicular breath sounds are present. There are no rales or rhonchi.  CARDIOVASCULAR:  S1 and S2, regular. No murmurs, gallops, or clicks. Pulses are equal at carotid, femoral, and pedal pulses. No peripheral edema.  GASTROINTESTINAL:  Abdomen is soft and nontender. No hepatosplenomegaly. No masses. No rigidity. No guarding. Bowel sounds are present and equal in all 4 quadrants.  GENITOURINARY:  Deferred.  MUSCULOSKELETAL:  No joint tenderness or effusion. Range of motion is adequate. Strength and tone are equal bilaterally.  SKIN:  Inspection is within normal limits.  LYMPHATIC:  No cervical lymphadenopathy.  VASCULAR:  Good dorsalis pedis and posterior tibial pulses.  NEUROLOGICAL:  The patient is awake and alert, not able to communicate because of advanced dementia. Cranial nerves II through XII are grossly intact. No sensory deficit. Motor strength is 5/5 in both upper and lower extremities. DTRs are 2+ bilaterally and symmetrical.  PSYCHIATRIC:  She is awake and alert, not able to communicate. Further examination not possible because of advanced dementia.   ANCILLARY DATA:   LABORATORY DATA:  Serum glucose is 129, BUN 24, creatinine 1.12, sodium 142, potassium 3.6, chloride 106, bicarbonate 25, total calcium 9.4, total protein 8.0, albumin, total bilirubin 0.5,  alkaline phosphatase 87, AST 12, ALT 10. Troponin is less than 0.03. WBC is 8.3, hemoglobin 13.9, hematocrit 42.6, and platelet count is 219,000.   Urinalysis:  Cloudy, nitrite positive, leukocyte esterase 3+, RBCs too numerous to count, WBC too numerous to count, bacteria many.   EKG:  Normal sinus rhythm with ventricular rate of 69 beats per minute. No acute ST-T changes.   ASSESSMENT AND PLAN:  An 81 year old Caucasian female, a resident of assisted living place with history of advanced dementia, hypertension, depression, and seasonal allergies, brought in with the complaints of increased confusion noted at her assisted living place. Workup revealed a significant urinary tract infection.   1.  Urinary tract infection. Plan:  Urine culture and sensitivity, intravenous ceftriaxone, and follow up cultures.  2.  History of dementia, which is advanced, total care dependent. No acute findings. Plan:  Continue supportive care, monitor, and continue home medications.  3.  Hypertension, stable on beta blocker. Continue same. Follow blood pressure recordings.  4.  Depression, stable on home medications. Continue same. 5.  Deep vein thrombosis prophylaxis. Subcutaneous  Lovenox.  6.  Gastrointestinal prophylaxis. Proton pump inhibitor.   CODE STATUS:  Full code.   TIME SPENT:  50 minutes.    ____________________________ Juluis Mire, MD enr:nb D: 03/20/2015 00:48:00 ET T: 03/20/2015 02:33:50 ET JOB#: [id number]  cc: Juluis Mire, MD, <Dictator> Dorian Heckle, MD Juluis Mire MD ELECTRONICALLY SIGNED 03/20/2015 19:23

## 2015-11-06 ENCOUNTER — Emergency Department: Payer: Medicare Other

## 2015-11-06 ENCOUNTER — Encounter: Payer: Self-pay | Admitting: Emergency Medicine

## 2015-11-06 ENCOUNTER — Inpatient Hospital Stay
Admission: EM | Admit: 2015-11-06 | Discharge: 2015-11-13 | DRG: 064 | Disposition: A | Discharge status: Skilled Nursing Facility | Payer: Medicare Other | Attending: Internal Medicine | Admitting: Internal Medicine

## 2015-11-06 DIAGNOSIS — R29810 Facial weakness: Secondary | ICD-10-CM | POA: Diagnosis present

## 2015-11-06 DIAGNOSIS — G309 Alzheimer's disease, unspecified: Secondary | ICD-10-CM | POA: Diagnosis present

## 2015-11-06 DIAGNOSIS — Z9011 Acquired absence of right breast and nipple: Secondary | ICD-10-CM

## 2015-11-06 DIAGNOSIS — I63512 Cerebral infarction due to unspecified occlusion or stenosis of left middle cerebral artery: Secondary | ICD-10-CM | POA: Diagnosis not present

## 2015-11-06 DIAGNOSIS — Y95 Nosocomial condition: Secondary | ICD-10-CM | POA: Diagnosis present

## 2015-11-06 DIAGNOSIS — R0602 Shortness of breath: Secondary | ICD-10-CM

## 2015-11-06 DIAGNOSIS — N3000 Acute cystitis without hematuria: Secondary | ICD-10-CM | POA: Diagnosis present

## 2015-11-06 DIAGNOSIS — R05 Cough: Secondary | ICD-10-CM

## 2015-11-06 DIAGNOSIS — F329 Major depressive disorder, single episode, unspecified: Secondary | ICD-10-CM | POA: Diagnosis present

## 2015-11-06 DIAGNOSIS — K625 Hemorrhage of anus and rectum: Secondary | ICD-10-CM

## 2015-11-06 DIAGNOSIS — R42 Dizziness and giddiness: Secondary | ICD-10-CM

## 2015-11-06 DIAGNOSIS — Z7982 Long term (current) use of aspirin: Secondary | ICD-10-CM

## 2015-11-06 DIAGNOSIS — R059 Cough, unspecified: Secondary | ICD-10-CM

## 2015-11-06 DIAGNOSIS — I951 Orthostatic hypotension: Secondary | ICD-10-CM | POA: Diagnosis present

## 2015-11-06 DIAGNOSIS — Z853 Personal history of malignant neoplasm of breast: Secondary | ICD-10-CM

## 2015-11-06 DIAGNOSIS — R4781 Slurred speech: Secondary | ICD-10-CM | POA: Diagnosis present

## 2015-11-06 DIAGNOSIS — F0150 Vascular dementia without behavioral disturbance: Secondary | ICD-10-CM | POA: Diagnosis present

## 2015-11-06 DIAGNOSIS — K922 Gastrointestinal hemorrhage, unspecified: Secondary | ICD-10-CM | POA: Diagnosis present

## 2015-11-06 DIAGNOSIS — Z66 Do not resuscitate: Secondary | ICD-10-CM | POA: Diagnosis present

## 2015-11-06 DIAGNOSIS — Z79899 Other long term (current) drug therapy: Secondary | ICD-10-CM

## 2015-11-06 DIAGNOSIS — I639 Cerebral infarction, unspecified: Secondary | ICD-10-CM

## 2015-11-06 DIAGNOSIS — G9340 Encephalopathy, unspecified: Secondary | ICD-10-CM

## 2015-11-06 DIAGNOSIS — J189 Pneumonia, unspecified organism: Secondary | ICD-10-CM | POA: Diagnosis present

## 2015-11-06 DIAGNOSIS — Z882 Allergy status to sulfonamides status: Secondary | ICD-10-CM

## 2015-11-06 DIAGNOSIS — F0280 Dementia in other diseases classified elsewhere without behavioral disturbance: Secondary | ICD-10-CM | POA: Diagnosis present

## 2015-11-06 DIAGNOSIS — F0390 Unspecified dementia without behavioral disturbance: Secondary | ICD-10-CM | POA: Diagnosis present

## 2015-11-06 DIAGNOSIS — R918 Other nonspecific abnormal finding of lung field: Secondary | ICD-10-CM

## 2015-11-06 HISTORY — DX: Cardiac murmur, unspecified: R01.1

## 2015-11-06 HISTORY — DX: Malignant (primary) neoplasm, unspecified: C80.1

## 2015-11-06 HISTORY — DX: Malignant neoplasm of unspecified site of unspecified female breast: C50.919

## 2015-11-06 LAB — CBC WITH DIFFERENTIAL/PLATELET
BASOS ABS: 0 10*3/uL (ref 0–0.1)
BASOS PCT: 1 %
Eosinophils Absolute: 0 10*3/uL (ref 0–0.7)
Eosinophils Relative: 0 %
HEMATOCRIT: 33.2 % — AB (ref 35.0–47.0)
HEMOGLOBIN: 10.9 g/dL — AB (ref 12.0–16.0)
Lymphocytes Relative: 41 %
Lymphs Abs: 2.8 10*3/uL (ref 1.0–3.6)
MCH: 28.7 pg (ref 26.0–34.0)
MCHC: 32.9 g/dL (ref 32.0–36.0)
MCV: 87.4 fL (ref 80.0–100.0)
Monocytes Absolute: 1.5 10*3/uL — ABNORMAL HIGH (ref 0.2–0.9)
Monocytes Relative: 23 %
NEUTROS ABS: 2.4 10*3/uL (ref 1.4–6.5)
NEUTROS PCT: 35 %
Platelets: 180 10*3/uL (ref 150–440)
RBC: 3.8 MIL/uL (ref 3.80–5.20)
RDW: 13.5 % (ref 11.5–14.5)
WBC: 6.8 10*3/uL (ref 3.6–11.0)

## 2015-11-06 LAB — URINALYSIS COMPLETE WITH MICROSCOPIC (ARMC ONLY)
BACTERIA UA: NONE SEEN
Bilirubin Urine: NEGATIVE
Glucose, UA: NEGATIVE mg/dL
HGB URINE DIPSTICK: NEGATIVE
Ketones, ur: NEGATIVE mg/dL
Nitrite: NEGATIVE
PH: 5 (ref 5.0–8.0)
PROTEIN: NEGATIVE mg/dL
Specific Gravity, Urine: 1.021 (ref 1.005–1.030)

## 2015-11-06 LAB — COMPREHENSIVE METABOLIC PANEL
ALBUMIN: 3.7 g/dL (ref 3.5–5.0)
ALT: 9 U/L — ABNORMAL LOW (ref 14–54)
AST: 12 U/L — AB (ref 15–41)
Alkaline Phosphatase: 75 U/L (ref 38–126)
Anion gap: 7 (ref 5–15)
BILIRUBIN TOTAL: 0.2 mg/dL — AB (ref 0.3–1.2)
BUN: 14 mg/dL (ref 6–20)
CHLORIDE: 107 mmol/L (ref 101–111)
CO2: 27 mmol/L (ref 22–32)
CREATININE: 0.93 mg/dL (ref 0.44–1.00)
Calcium: 9 mg/dL (ref 8.9–10.3)
GFR calc Af Amer: >60 mL/min (ref >60)
GFR calc non Af Amer: 55 mL/min — ABNORMAL LOW (ref >60)
GLUCOSE: 99 mg/dL (ref 65–99)
POTASSIUM: 3.7 mmol/L (ref 3.5–5.1)
Sodium: 141 mmol/L (ref 135–145)
TOTAL PROTEIN: 6.9 g/dL (ref 6.5–8.1)

## 2015-11-06 LAB — LIPASE, BLOOD: Lipase: 29 U/L (ref 11–51)

## 2015-11-06 LAB — TROPONIN I: TROPONIN I: 0.03 ng/mL (ref <0.031)

## 2015-11-06 LAB — GLUCOSE, CAPILLARY: GLUCOSE-CAPILLARY: 86 mg/dL (ref 65–99)

## 2015-11-06 MED ORDER — ASPIRIN 81 MG PO CHEW
324.0000 mg | CHEWABLE_TABLET | Freq: Once | ORAL | Status: AC
  Administered 2015-11-06: 324 mg via ORAL
  Filled 2015-11-06: qty 4

## 2015-11-06 MED ORDER — SODIUM CHLORIDE 0.9 % IV BOLUS (SEPSIS)
1000.0000 mL | Freq: Once | INTRAVENOUS | Status: AC
  Administered 2015-11-06: 1000 mL via INTRAVENOUS

## 2015-11-06 NOTE — ED Notes (Addendum)
Pt daughters at bedside

## 2015-11-06 NOTE — ED Provider Notes (Signed)
[facility] Emergency Department Provider Note REMINDER - THIS NOTE IS NOT A FINAL MEDICAL RECORD UNTIL IT IS SIGNED. UNTIL THEN, THE CONTENT BELOW MAY REFLECT INFORMATION FROM A DOCUMENTATION TEMPLATE, NOT THE ACTUAL PATIENT VISIT. ____________________________________________  Time seen: Approximately 11:33 PM  I have reviewed the triage vital signs and the nursing notes. EM caveat: History and physical slightly limited by patient's dementia  HISTORY  Chief Complaint Altered Mental Status    HPI [name] is a 81 y.o. female history of dementia, agitation is on a memory care.  Patient's daughter saw her this morning and thought that she looked fairly well, but seems slightly more agitated than frequent. She received to call stroke the day that she been having some agitation and mild confusion, and nursing facility gave her additional doses of Seroquel. Today nursing facility discussed with her daughter that she was having dinner and it seems like she is having difficulty with using copper and they're concerned about a possible stroke. Her last clearly known well time was about 8:30 or 9 AM, and symptoms were noted to be more pronounced around 5:30 PM but there is no clear or obvious time of onset of any acute deficit.  The patient does not complain of any pain. The patient states that she "is just fine".   Past Medical History  Diagnosis Date  . Cancer ([place])   . Heart murmur     There are no active problems to display for this patient.   Past Surgical History  Procedure Laterality Date  . Mastectomy  Rt side    1998  . Bladder tack    . Rotator cuff repair Right     Current Outpatient Rx  Name  Route  Sig  Dispense  Refill  . cetirizine (ZYRTEC) 10 MG tablet   Oral   Take 10 mg by mouth daily as needed for allergies.         Marland Kitchen donepezil (ARICEPT) 10 MG tablet   Oral   Take 10 mg by mouth at bedtime.         . fluticasone (FLONASE) 50  MCG/ACT nasal spray   Each Nare   Place 1 spray into both nostrils daily.         . memantine (NAMENDA XR) 28 MG CP24 24 hr capsule   Oral   Take 28 mg by mouth daily.         . Menthol-Zinc Oxide (RISAMINE) 0.44-20.625 % OINT   Apply externally   Apply 1 application topically as needed (to affected areas).         . metoprolol tartrate (LOPRESSOR) 25 MG tablet   Oral   Take 25 mg by mouth 2 (two) times daily.         . QUEtiapine (SEROQUEL) 200 MG tablet   Oral   Take 200 mg by mouth 2 (two) times daily as needed (for agitation).         . sertraline (ZOLOFT) 100 MG tablet   Oral   Take 100 mg by mouth daily.         . traZODone (DESYREL) 50 MG tablet   Oral   Take 50 mg by mouth at bedtime.           Allergies Sulfa antibiotics  No family history on file.  Social History Social History  Substance Use Topics  . Smoking status: Never Smoker   . Smokeless tobacco: None  . Alcohol Use: No  Review of Systems Some limitation primarily via family Constitutional: No fever/chills Eyes: No visual changes. Gastrointestinal: No abdominal pain.  No nausea, no vomiting.  No diarrhea.  No constipation. Genitourinary: Negative for dysuria. Musculoskeletal: Negative for back pain. Skin: Negative for rash. Neurological: Negative for headaches, patient denies any weakness. Family denies noticing any obvious numbness or weakness or slurred speech or facial droop.  10-point ROS otherwise negative.  ____________________________________________   PHYSICAL EXAM:  VITAL SIGNS: ED Triage Vitals  Enc Vitals Group     BP 11/06/15 2100 124/62 mmHg     Pulse Rate 11/06/15 2102 68     Resp 11/06/15 2045 15     Temp 11/06/15 2102 97.9 F (36.6 C)     Temp Source 11/06/15 2102 Oral     SpO2 11/06/15 2102 97 %     Weight 11/06/15 2102 133 lb 1.6 oz (60.374 kg)     Height 11/06/15 2102 5\' 4"  (1.626 m)     Head Cir --      Peak Flow --      Pain Score --       Pain Loc --      Pain Edu? --      Excl. in [place]? --    Constitutional: Alert but disoriented to situation, oriented to family. Well appearing and in no acute distress. Eyes: Conjunctivae are normal. PERRL. EOMI. Head: Atraumatic. Nose: No congestion/rhinnorhea. Mouth/Throat: Mucous membranes are dry.   Neck: No stridor.   Cardiovascular: Normal rate, regular rhythm. Grossly normal heart sounds.  Good peripheral circulation. Respiratory: Normal respiratory effort.  No retractions. Lungs CTAB. Gastrointestinal: Soft and nontender. No distention. No abdominal bruits. No CVA tenderness. Musculoskeletal: No lower extremity tenderness nor edema.  No joint effusions. Neurologic:  Somewhat limited due to poor participation  The patient has no pronator drift. The patient has normal cranial nerve exam. Extraocular movements are normal. Visual fields are difficult to test, the patient able to read fingers with both eyes. Patient has normal strength in all extremities without any gross deficit noted. There is no numbness or gross, acute sensory abnormality in the extremities bilaterally. No speech disturbance. No dysarthria. No aphasia. No ataxia. Patient speaks in very soft sentences, generally only speaks one to 2 words at a time.   Skin:  Skin is warm, dry and intact. No rash noted. Psychiatric: Mood and affect are normal. Speech and behavior are normal.  ____________________________________________   LABS (all labs ordered are listed, but only abnormal results are displayed)  Labs Reviewed  CBC WITH DIFFERENTIAL/PLATELET - Abnormal; Notable for the following:    Hemoglobin 10.9 (*)    HCT 33.2 (*)    Monocytes Absolute 1.5 (*)    All other components within normal limits  COMPREHENSIVE METABOLIC PANEL - Abnormal; Notable for the following:    AST 12 (*)    ALT 9 (*)    Total Bilirubin 0.2 (*)    GFR calc non Af Amer 55 (*)    All other components within normal limits   URINALYSIS COMPLETEWITH MICROSCOPIC ([facility] ONLY) - Abnormal; Notable for the following:    Color, Urine YELLOW (*)    APPearance CLEAR (*)    Leukocytes, UA 1+ (*)    Squamous Epithelial / LPF 0-5 (*)    All other components within normal limits  LIPASE, BLOOD  TROPONIN I  GLUCOSE, CAPILLARY  CBG MONITORING, ED   ____________________________________________  EKG  ED ECG REPORT I, Mckenzy Salazar, the attending physician,  personally viewed and interpreted this ECG.  Date: 11/06/2015 EKG Time: 2110 Rate: 60 Rhythm: normal sinus rhythm QRS Axis: normal Intervals: normal ST/T Wave abnormalities: normal Conduction Disutrbances: none Narrative Interpretation: unremarkable  ____________________________________________  RADIOLOGY  CT Head Wo Contrast (Final result) Result time: 11/06/15 21:54:37   Final result by Rad Results In Interface (11/06/15 21:54:37)   Narrative:   CLINICAL DATA: Confusion. Altered mental status. History of right breast cancer and right mastectomy.  EXAM: CT HEAD WITHOUT CONTRAST  TECHNIQUE: Contiguous axial images were obtained from the base of the skull through the vertex without intravenous contrast.  COMPARISON: None.  FINDINGS: There is questionable loss of gray-white differentiation in the left frontotemporal region. There is a small low-density focus in the genu of the left internal capsule. No evidence of parenchymal hemorrhage or extra-axial fluid collection. No mass lesion, mass effect, or midline shift.  Intracranial atherosclerosis. Nonspecific subcortical and periventricular white matter hypodensity, most in keeping with chronic small vessel ischemic change.  There is diffuse cerebral volume loss. No ventriculomegaly.  The visualized paranasal sinuses are essentially clear. The mastoid air cells are unopacified. No evidence of calvarial fracture.  IMPRESSION: 1. Questionable loss of gray-white differentiation in the  left frontotemporal region, cannot exclude an acute stroke in this location. If clinically warranted, consider further evaluation with brain MRI. 2. Small low-density focus in the genu of the left internal capsule, likely a remote lacunar infarct. 3. Intracranial atherosclerosis, diffuse cerebral volume loss and chronic small vessel ischemic white matter change.  These results were called by telephone at the time of interpretation on 11/06/2015 at 9:50 pm to Dr. Delman Kitten , who verbally acknowledged these results.   Electronically Signed By: Ilona Sorrel M.D. On: 11/06/2015 21:54          DG Chest Port 1 View (Final result) Result time: 11/06/15 21:24:34   Final result by Rad Results In Interface (11/06/15 21:24:34)   Narrative:   CLINICAL DATA: Altered mental status.  EXAM: PORTABLE CHEST 1 VIEW  COMPARISON: None.  FINDINGS: 2115 hours. There are low lung volumes with patchy left-greater-than-right basilar airspace opacities. The heart size and mediastinal contours are unremarkable for the degree of inspiration. There is mild aortic atherosclerosis. No pneumothorax or significant pleural effusion identified. There are mild degenerative changes at both shoulders.  IMPRESSION: Low lung volumes with patchy bibasilar opacities, possibly atelectasis. Early aspiration cannot be excluded, especially at the left base.    ____________________________________________   PROCEDURES  Procedure(s) performed: None  Critical Care performed: No  ____________________________________________   INITIAL IMPRESSION / ASSESSMENT AND PLAN / ED COURSE  Pertinent labs & imaging results that were available during my care of the patient were reviewed by me and considered in my medical decision making (see chart for details).  Patient presents with what appears to be slightly worsening over a slight mental status, and the differential diagnosis very broad. Somewhat  limited examination, time of onset seems to be slowly and gradual worsening possibly at some point through the late morning to mid-day area there is no clear evidence of a stroke, patient appears to present with what sounds more consistent for like delirium versus toxic metabolic.  Exam nonfocal, head CT is questionable findings which could represent a stroke though again not clearly clinically demonstrated but not impossible. Discussed with neurology Dr. Kingsley Spittle who advises to give aspirin and admitted for MRI. Chest x-ray with questionable opacities, not coughing, no leukocytosis, no fever. Unclear exactly what this represents though  mild aspiration is considered. Continue to follow clinically. ____________________________________________   FINAL CLINICAL IMPRESSION(S) / ED DIAGNOSES  Final diagnoses:  Opacity of lung on imaging study  Encephalopathy      Delman Kitten, MD 11/06/15 2351

## 2015-11-06 NOTE — ED Notes (Signed)
Patient transported to CT 

## 2015-11-06 NOTE — ED Notes (Signed)
XR at bedside

## 2015-11-06 NOTE — ED Notes (Signed)
EKG done at this time has RIGHT AND LEFT ARM REVERSAL -- delete this EKG

## 2015-11-06 NOTE — ED Notes (Signed)
Per EMS staff at [facility] on the memory floor report that patient refused to eat dinner and "dumped out her drink". Per EMS pt was given 2 tablets Seroquel which according to staff "does not touch pt". Per pt's family pt is usually alert and oriented. Pt is alert to self at this time with NAD noted.

## 2015-11-07 ENCOUNTER — Encounter: Payer: Self-pay | Admitting: Internal Medicine

## 2015-11-07 ENCOUNTER — Observation Stay: Payer: Medicare Other

## 2015-11-07 ENCOUNTER — Observation Stay (HOSPITAL_BASED_OUTPATIENT_CLINIC_OR_DEPARTMENT_OTHER)
Admit: 2015-11-07 | Discharge: 2015-11-07 | Disposition: A | Discharge status: Home / Self Care | Payer: Medicare Other | Attending: Internal Medicine | Admitting: Internal Medicine

## 2015-11-07 DIAGNOSIS — G459 Transient cerebral ischemic attack, unspecified: Secondary | ICD-10-CM

## 2015-11-07 DIAGNOSIS — G9340 Encephalopathy, unspecified: Secondary | ICD-10-CM | POA: Diagnosis present

## 2015-11-07 LAB — TSH: TSH: 2.325 u[IU]/mL (ref 0.350–4.500)

## 2015-11-07 MED ORDER — SERTRALINE HCL 50 MG PO TABS
100.0000 mg | ORAL_TABLET | Freq: Every day | ORAL | Status: DC
  Administered 2015-11-07 – 2015-11-13 (×7): 100 mg via ORAL
  Filled 2015-11-07 (×7): qty 2

## 2015-11-07 MED ORDER — LEVOFLOXACIN IN D5W 750 MG/150ML IV SOLN
750.0000 mg | INTRAVENOUS | Status: DC
  Administered 2015-11-07 – 2015-11-09 (×2): 750 mg via INTRAVENOUS
  Filled 2015-11-07 (×3): qty 150

## 2015-11-07 MED ORDER — ACETAMINOPHEN 325 MG PO TABS: 650.0000 mg | ORAL_TABLET | Freq: Four times a day (QID) | ORAL | Status: DC | PRN

## 2015-11-07 MED ORDER — ONDANSETRON HCL 4 MG PO TABS: 4.0000 mg | ORAL_TABLET | Freq: Four times a day (QID) | ORAL | Status: DC | PRN

## 2015-11-07 MED ORDER — ASPIRIN EC 81 MG PO TBEC
81.0000 mg | DELAYED_RELEASE_TABLET | Freq: Every day | ORAL | Status: DC
  Administered 2015-11-07 – 2015-11-08 (×2): 81 mg via ORAL
  Filled 2015-11-07 (×3): qty 1

## 2015-11-07 MED ORDER — METOPROLOL TARTRATE 25 MG PO TABS
25.0000 mg | ORAL_TABLET | Freq: Two times a day (BID) | ORAL | Status: DC
  Administered 2015-11-07 – 2015-11-13 (×9): 25 mg via ORAL
  Filled 2015-11-07 (×10): qty 1

## 2015-11-07 MED ORDER — ZINC OXIDE 20 % EX OINT: TOPICAL_OINTMENT | CUTANEOUS | Status: DC | PRN

## 2015-11-07 MED ORDER — ENSURE ENLIVE PO LIQD
237.0000 mL | Freq: Three times a day (TID) | ORAL | Status: DC
  Administered 2015-11-07 – 2015-11-13 (×15): 237 mL via ORAL

## 2015-11-07 MED ORDER — ONDANSETRON HCL 4 MG/2ML IJ SOLN: 4.0000 mg | Freq: Four times a day (QID) | INTRAMUSCULAR | Status: DC | PRN

## 2015-11-07 MED ORDER — QUETIAPINE FUMARATE 200 MG PO TABS
200.0000 mg | ORAL_TABLET | Freq: Two times a day (BID) | ORAL | Status: DC | PRN
  Administered 2015-11-07 – 2015-11-11 (×3): 200 mg via ORAL
  Filled 2015-11-07: qty 1
  Filled 2015-11-07 (×2): qty 2
  Filled 2015-11-07: qty 1

## 2015-11-07 MED ORDER — ACETAMINOPHEN 650 MG RE SUPP: 650.0000 mg | Freq: Four times a day (QID) | RECTAL | Status: DC | PRN

## 2015-11-07 MED ORDER — SODIUM CHLORIDE 0.9 % IV SOLN
INTRAVENOUS | Status: DC
  Administered 2015-11-07 – 2015-11-12 (×10): via INTRAVENOUS

## 2015-11-07 MED ORDER — MEMANTINE HCL ER 28 MG PO CP24
28.0000 mg | ORAL_CAPSULE | Freq: Every day | ORAL | Status: DC
  Administered 2015-11-07 – 2015-11-13 (×7): 28 mg via ORAL
  Filled 2015-11-07 (×7): qty 1

## 2015-11-07 MED ORDER — MENTHOL-ZINC OXIDE 0.44-20.625 % EX OINT: 1.0000 "application " | TOPICAL_OINTMENT | CUTANEOUS | Status: DC | PRN

## 2015-11-07 MED ORDER — TRAZODONE HCL 50 MG PO TABS
50.0000 mg | ORAL_TABLET | Freq: Every day | ORAL | Status: DC
  Administered 2015-11-07 – 2015-11-11 (×4): 50 mg via ORAL
  Filled 2015-11-07 (×4): qty 1

## 2015-11-07 MED ORDER — DONEPEZIL HCL 5 MG PO TABS
10.0000 mg | ORAL_TABLET | Freq: Every day | ORAL | Status: DC
  Administered 2015-11-07 – 2015-11-11 (×4): 10 mg via ORAL
  Filled 2015-11-07 (×4): qty 2

## 2015-11-07 MED ORDER — PIPERACILLIN-TAZOBACTAM 3.375 G IVPB 30 MIN: 3.3750 g | Freq: Three times a day (TID) | INTRAVENOUS | Status: DC

## 2015-11-07 MED ORDER — LORATADINE 10 MG PO TABS
10.0000 mg | ORAL_TABLET | Freq: Every day | ORAL | Status: DC
Start: 2015-11-07 — End: 2015-11-13
  Administered 2015-11-07 – 2015-11-13 (×7): 10 mg via ORAL
  Filled 2015-11-07 (×7): qty 1

## 2015-11-07 MED ORDER — DOCUSATE SODIUM 100 MG PO CAPS
100.0000 mg | ORAL_CAPSULE | Freq: Two times a day (BID) | ORAL | Status: DC
  Administered 2015-11-07 – 2015-11-13 (×9): 100 mg via ORAL
  Filled 2015-11-07 (×11): qty 1

## 2015-11-07 MED ORDER — PIPERACILLIN-TAZOBACTAM 3.375 G IVPB
3.3750 g | Freq: Three times a day (TID) | INTRAVENOUS | Status: DC
  Administered 2015-11-07 – 2015-11-08 (×3): 3.375 g via INTRAVENOUS
  Filled 2015-11-07 (×5): qty 50

## 2015-11-07 MED ORDER — LORAZEPAM 2 MG/ML IJ SOLN
0.5000 mg | Freq: Once | INTRAMUSCULAR | Status: AC
  Administered 2015-11-07: 0.5 mg via INTRAVENOUS
  Filled 2015-11-07: qty 1

## 2015-11-07 MED ORDER — FLUTICASONE PROPIONATE 50 MCG/ACT NA SUSP
1.0000 | Freq: Every day | NASAL | Status: DC
  Administered 2015-11-07 – 2015-11-13 (×6): 1 via NASAL
  Filled 2015-11-07: qty 16

## 2015-11-07 MED ORDER — STROKE: EARLY STAGES OF RECOVERY BOOK
Freq: Once | Status: AC
  Administered 2015-11-07: 04:00:00

## 2015-11-07 NOTE — Consult Note (Signed)
CC: confusion/disorientation   HPI: [name] is an 81 y.o. female with history of long term dementia.  At baseline she doesn't know date, time, location. There was suspicion that pt was weak on R side with R facial droop.  No focal weakness this AM.  CTH no acute abnormality   Past Medical History  Diagnosis Date  . Cancer ([place])   . Heart murmur   . Breast cancer [facility])     h/o    Past Surgical History  Procedure Laterality Date  . Mastectomy  Rt side    1998  . Bladder tack    . Rotator cuff repair Right     Family History  Problem Relation Age of Onset  . Leukemia Sister   . CAD Father     Social History:  reports that she has never smoked. She does not have any smokeless tobacco history on file. She reports that she does not drink alcohol or use illicit drugs.  Allergies  Allergen Reactions  . Sulfa Antibiotics Hives    Medications: I have reviewed the patient's current medications.  ROS: Not able to obtain due to history of dementia.    Physical Examination: Blood pressure 130/62, pulse 78, temperature 98.2 F (36.8 C), temperature source Oral, resp. rate 20, height 5\' 6"  (1.676 m), weight 133 lb 3.2 oz (60.419 kg), SpO2 100 %.    Neurological Examination Mental Status: Alert to name, agitated  Cranial Nerves: II: Discs flat bilaterally; Visual fields grossly normal, pupils equal, round, reactive to light and accommodation III,IV, VI: ptosis not present, extra-ocular motions intact bilaterally V,VII: smile symmetric, facial light touch sensation normal bilaterally VIII: hearing normal bilaterally IX,X: gag reflex present XI: bilateral shoulder shrug XII: midline tongue extension Motor: Right : Upper extremity   4+/5    Left:     Upper extremity   4+/5  Lower extremity   4+/5     Lower extremity   5/5 Tone and bulk:normal tone throughout; no atrophy noted Sensory: Pinprick and light touch intact throughout, bilaterally Deep Tendon Reflexes: 1+  and symmetric throughout Plantars: Right: downgoing   Left: downgoing Cerebellar: Not tested  Gait: not tested       Laboratory Studies:   Basic Metabolic Panel:  Recent Labs Lab 11/06/15 2124  NA 141  K 3.7  CL 107  CO2 27  GLUCOSE 99  BUN 14  CREATININE 0.93  CALCIUM 9.0    Liver Function Tests:  Recent Labs Lab 11/06/15 2124  AST 12*  ALT 9*  ALKPHOS 75  BILITOT 0.2*  PROT 6.9  ALBUMIN 3.7    Recent Labs Lab 11/06/15 2124  LIPASE 29   No results for input(s): AMMONIA in the last 168 hours.  CBC:  Recent Labs Lab 11/06/15 2124  WBC 6.8  NEUTROABS 2.4  HGB 10.9*  HCT 33.2*  MCV 87.4  PLT 180    Cardiac Enzymes:  Recent Labs Lab 11/06/15 2124  TROPONINI 0.03    BNP: Invalid input(s): POCBNP  CBG:  Recent Labs Lab 11/06/15 2114  GLUCAP 58    Microbiology: Results for orders placed or performed during the hospital encounter of 03/20/15  Urine culture     Status: None   Collection Time: 03/19/15  8:08 PM  Result Value Ref Range Status   Micro Text Report   Final       SOURCE: CLEAN CATCH    ORGANISM 1                >  100,000 CFU/ML Escherichia coli   COMMENT                   WITH MIXED-BACTERIAL-ORGANISMS   ANTIBIOTIC                    ORG#1     AMPICILLIN                    R         CEFAZOLIN                     S         CEFOXITIN                     S         CEFTRIAXONE                   S         CIPROFLOXACIN                 R         ERTAPENEM                     S         GENTAMICIN                    S         IMIPENEM                      S         LEVOFLOXACIN                  R         NITROFURANTOIN                S         Trimethoprim/Sulfamethoxazole S             Coagulation Studies: No results for input(s): LABPROT, INR in the last 72 hours.  Urinalysis:  Recent Labs Lab 11/06/15 2151  COLORURINE YELLOW*  LABSPEC 1.021  PHURINE 5.0  GLUCOSEU NEGATIVE  HGBUR NEGATIVE  BILIRUBINUR  NEGATIVE  KETONESUR NEGATIVE  PROTEINUR NEGATIVE  NITRITE NEGATIVE  LEUKOCYTESUR 1+*    Lipid Panel:  No results found for: CHOL, TRIG, HDL, CHOLHDL, VLDL, LDLCALC  HgbA1C: No results found for: HGBA1C  Urine Drug Screen:  No results found for: LABOPIA, COCAINSCRNUR, LABBENZ, AMPHETMU, THCU, LABBARB  Alcohol Level: No results for input(s): ETH in the last 168 hours.   Ct Head Wo Contrast  11/06/2015  CLINICAL DATA:  Confusion. Altered mental status. History of right breast cancer and right mastectomy. EXAM: CT HEAD WITHOUT CONTRAST TECHNIQUE: Contiguous axial images were obtained from the base of the skull through the vertex without intravenous contrast. COMPARISON:  None. FINDINGS: There is questionable loss of gray-white differentiation in the left frontotemporal region. There is a small low-density focus in the genu of the left internal capsule. No evidence of parenchymal hemorrhage or extra-axial fluid collection. No mass lesion, mass effect, or midline shift. Intracranial atherosclerosis. Nonspecific subcortical and periventricular white matter hypodensity, most in keeping with chronic small vessel ischemic change. There is diffuse cerebral volume loss. No ventriculomegaly. The visualized paranasal sinuses are essentially clear. The mastoid air cells are unopacified. No evidence of calvarial fracture. IMPRESSION: 1. Questionable loss of gray-white  differentiation in the left frontotemporal region, cannot exclude an acute stroke in this location. If clinically warranted, consider further evaluation with brain MRI. 2. Small low-density focus in the genu of the left internal capsule, likely a remote lacunar infarct. 3. Intracranial atherosclerosis, diffuse cerebral volume loss and chronic small vessel ischemic white matter change. These results were called by telephone at the time of interpretation on 11/06/2015 at 9:50 pm to Dr. Delman Kitten , who verbally acknowledged these results. Electronically  Signed   By: Ilona Sorrel M.D.   On: 11/06/2015 21:54   Dg Chest Port 1 View  11/06/2015  CLINICAL DATA:  Altered mental status. EXAM: PORTABLE CHEST 1 VIEW COMPARISON:  None. FINDINGS: 2115 hours. There are low lung volumes with patchy left-greater-than-right basilar airspace opacities. The heart size and mediastinal contours are unremarkable for the degree of inspiration. There is mild aortic atherosclerosis. No pneumothorax or significant pleural effusion identified. There are mild degenerative changes at both shoulders. IMPRESSION: Low lung volumes with patchy bibasilar opacities, possibly atelectasis. Early aspiration cannot be excluded, especially at the left base. Electronically Signed   By: Richardean Sale M.D.   On: 11/06/2015 21:24     Assessment/Plan:  [name] is an 81 y.o. female with history of long term dementia.  At baseline she doesn't know date, time, location. There was suspicion that pt was weak on R side with R facial droop.  No focal weakness this AM.  CTH no acute abnormality   Case d/w with pt's daughter via phone [phone]. Also her cell number is [phone]  Pt at baseline needs full  Assistance with dressing and ambulating.  She is able to feed herself.  At baseline pt would not know date, time, location due to chronic progressive dementia.   Not convinced pt is far off from her baseline.  Still would like to see MRI of head w/out contrast if possible ( pt is very agitated) to  Look for small subcortical ischemia.   Anti platelet therapy.   Likely d/c back to facility tomorrow or early next week.    Leotis Pain   11/07/2015, 9:45 AM     Addendum:  L MCA stroke that is contributing to speech impairment.    Needs pt/ot  Will attempt to call daughter. Leotis Pain

## 2015-11-07 NOTE — Care Management Obs Status (Signed)
[place] [facility] NOTIFICATION   Patient Details  Name: [name] MRN: [id number] Date of Birth: 08-Sep-1928   Medicare Observation Status Notification Given:  Yes (patient with altered mental status no family present/ original left in patient room-copy to HIM scanning)    Ival Bible, RN 11/07/2015, 12:49 PM

## 2015-11-07 NOTE — Progress Notes (Signed)
Alert, very confused. Constantly climbing OOB with bed alarm activating frequently even with staff in room. Pt responds to command, but immediately forgets and starts out of bed. TV and music, repeated education/reminders not to get OOB ineffective. Will move pt to closer room at [facility] when available with Gerald Stabs, charge nurse notified. Picking at IV site with tape/dressing applied for protection. Pt now has been moved to NS in recliner for closer supervision. TC with Traci, dgt and updated with this agreeable. Diversional activities such as activity apron, wash clothes provided with pt not engaged. States she wants to go home/continues impulsive/gets agitated with interactions if pursued.

## 2015-11-07 NOTE — Progress Notes (Signed)
Staffed with Dr. Margaretmary Eddy with pt condition changes and concerns. Awaiting MRI. Speech more slurred,dysarthric, more difficulty following commands, color pale. Will await MRI per MD. Family updated and at bedside with pt. Pt calmer and not trying to climb OOB.

## 2015-11-07 NOTE — Plan of Care (Signed)
Problem: Nutrition: Goal: Risk of aspiration will decrease Outcome: Progressing Pt is alert to self and denies pain. Pt is impulsive. Family was at bedside and later left for the rest of shift. IV fluids running. Pt attempted to remove IV, site is wrapped to protect the IV. No other signs of distress noted. Will continue to monitor.

## 2015-11-07 NOTE — Progress Notes (Signed)
[place] [facility] [place] NAME: [name]    MR#:  [id number]  DATE OF BIRTH:  09-17-1928  SUBJECTIVE:  CHIEF COMPLAINT:  Patient is with altered mental status, intermittent episodes of being agitated. Could not recognize her daughter. Daughter reports patient is still with slurry speech  REVIEW OF SYSTEMS:  Unobtainable because of the altered mental status  DRUG ALLERGIES:   Allergies  Allergen Reactions  . Sulfa Antibiotics Hives    VITALS:  Blood pressure 97/49, pulse 79, temperature 97.5 F (36.4 C), temperature source Oral, resp. rate 20, height 5\' 6"  (1.676 m), weight 60.419 kg (133 lb 3.2 oz), SpO2 98 %.  PHYSICAL EXAMINATION:  GENERAL:  81 y.o.-year-old patient lying in the bed with no acute distress.  EYES: Pupils equal, round, reactive to light and accommodation. No scleral icterus. Extraocular muscles grossly intact.  HEENT: Head atraumatic, normocephalic. Oropharynx and nasopharynx clear.  NECK:  Supple, no jugular venous distention. No thyroid enlargement, no tenderness.  LUNGS: Normal breath sounds bilaterally, no wheezing, rales,rhonchi or crepitation. No use of accessory muscles of respiration.  CARDIOVASCULAR: S1, S2 normal. No murmurs, rubs, or gallops.  ABDOMEN: Soft, nontender, nondistended. Bowel sounds present. No organomegaly or mass.  EXTREMITIES: No pedal edema, cyanosis, or clubbing.  NEUROLOGIC: Cranial nerves II through XII are grossly intact. Muscle strength 5/5 in all extremities. Sensation intact. Gait not checked. Dysarthric speech PSYCHIATRIC: The patient is alert and disoriented SKIN: No obvious rash, lesion, or ulcer.    LABORATORY PANEL:   CBC  Recent Labs Lab 11/06/15 2124  WBC 6.8  HGB 10.9*  HCT 33.2*  PLT 180   ------------------------------------------------------------------------------------------------------------------  Chemistries   Recent Labs Lab 11/06/15 2124  NA  141  K 3.7  CL 107  CO2 27  GLUCOSE 99  BUN 14  CREATININE 0.93  CALCIUM 9.0  AST 12*  ALT 9*  ALKPHOS 75  BILITOT 0.2*   ------------------------------------------------------------------------------------------------------------------  Cardiac Enzymes  Recent Labs Lab 11/06/15 2124  TROPONINI 0.03   ------------------------------------------------------------------------------------------------------------------  RADIOLOGY:  Ct Head Wo Contrast  11/06/2015  CLINICAL DATA:  Confusion. Altered mental status. History of right breast cancer and right mastectomy. EXAM: CT HEAD WITHOUT CONTRAST TECHNIQUE: Contiguous axial images were obtained from the base of the skull through the vertex without intravenous contrast. COMPARISON:  None. FINDINGS: There is questionable loss of gray-white differentiation in the left frontotemporal region. There is a small low-density focus in the genu of the left internal capsule. No evidence of parenchymal hemorrhage or extra-axial fluid collection. No mass lesion, mass effect, or midline shift. Intracranial atherosclerosis. Nonspecific subcortical and periventricular white matter hypodensity, most in keeping with chronic small vessel ischemic change. There is diffuse cerebral volume loss. No ventriculomegaly. The visualized paranasal sinuses are essentially clear. The mastoid air cells are unopacified. No evidence of calvarial fracture. IMPRESSION: 1. Questionable loss of gray-white differentiation in the left frontotemporal region, cannot exclude an acute stroke in this location. If clinically warranted, consider further evaluation with brain MRI. 2. Small low-density focus in the genu of the left internal capsule, likely a remote lacunar infarct. 3. Intracranial atherosclerosis, diffuse cerebral volume loss and chronic small vessel ischemic white matter change. These results were called by telephone at the time of interpretation on 11/06/2015 at 9:50 pm to Dr.  Delman Kitten , who verbally acknowledged these results. Electronically Signed   By: Ilona Sorrel M.D.   On: 11/06/2015 21:54   Dg Chest Port 1  View  11/06/2015  CLINICAL DATA:  Altered mental status. EXAM: PORTABLE CHEST 1 VIEW COMPARISON:  None. FINDINGS: 2115 hours. There are low lung volumes with patchy left-greater-than-right basilar airspace opacities. The heart size and mediastinal contours are unremarkable for the degree of inspiration. There is mild aortic atherosclerosis. No pneumothorax or significant pleural effusion identified. There are mild degenerative changes at both shoulders. IMPRESSION: Low lung volumes with patchy bibasilar opacities, possibly atelectasis. Early aspiration cannot be excluded, especially at the left base. Electronically Signed   By: Richardean Sale M.D.   On: 11/06/2015 21:24    EKG:   Orders placed or performed during the hospital encounter of 11/06/15  . ED EKG  . ED EKG    ASSESSMENT AND PLAN:   This is an 81 year old Caucasian female admitted for encephalopathy and possible stroke.  #. Encephalopathy: Could be from CVA/acute cystitis/pneumonia Patient is altered, has baseline Alzheimer's dementia  #. CVA: Possible;  CT head is nonconclusive  We will get MRA of the brain, carotid Dopplers and 2-D echocardiogram  Get neuro checks  Neurology consult is placed Will ask RN to do bedside swallow evaluation, if not done so far Check fasting lipid panel and start patient on statin Continue aspirin  #Abnormal urinalysis We will get urine culture and sensitivity and start the patient on IV levofloxacin for possible UTI  #Pneumonia Will put the patient on aspiration precautions Treat her for healthcare associated pneumonia with Zosyn and levofloxacin as patient is from a healthcare facility We'll get sputum culture and sensitivity  #Orthostatic hypotension Gentle hydration with IV fluids  #. Dementia: Continue Aricept and Namenda; Seroquel as needed  and trazodone daily at bedtime per home regimen  #. Depression: Continue Zoloft      #D VT prophylaxis: SCDs    The patient is a DO NOT RESUSCITATE.       All the records are reviewed and case discussed with Care Management/Social Workerr. Management plans discussed with the patient, daughter at bedside  and she is in in agreement.  CODE STATUS: DO NOT RESUSCITATE  TOTAL TIME TAKING CARE OF THIS PATIENT: 35 minutes.   POSSIBLE D/C IN 2- 3 DAYS, DEPENDING ON CLINICAL CONDITION.   Nicholes Mango M.D on 11/07/2015 at 3:37 PM  Between 7am to 6pm - Pager - [phone] After 6pm go to [email or web address] - password [email or web address] [facility]  [facility]  Office  [phone]  CC: Primary care physician; Mathews Argyle, MD

## 2015-11-07 NOTE — Progress Notes (Signed)
*  PRELIMINARY RESULTS* Echocardiogram 2D Echocardiogram has been performed.  [name] 11/07/2015, 5:41 PM

## 2015-11-07 NOTE — Consult Note (Signed)
ANTIBIOTIC CONSULT NOTE - INITIAL  Pharmacy Consult for levofloxacin Indication: pneumonia  Allergies  Allergen Reactions  . Sulfa Antibiotics Hives    Patient Measurements: Height: 5\' 6"  (167.6 cm) Weight: 133 lb 3.2 oz (60.419 kg) IBW/kg (Calculated) : 59.3 Adjusted Body Weight:   Vital Signs: Temp: 97.5 F (36.4 C) (12/03 1346) Temp Source: Oral (12/03 1346) BP: 97/49 mmHg (12/03 1353) Pulse Rate: 79 (12/03 1353) Intake/Output from previous day:   Intake/Output from this shift: Total I/O In: 968 [P.O.:120; I.V.:848] Out: -   Labs:  Recent Labs  11/06/15 2124  WBC 6.8  HGB 10.9*  PLT 180  CREATININE 0.93   Estimated Creatinine Clearance: 42.2 mL/min (by C-G formula based on Cr of 0.93). No results for input(s): VANCOTROUGH, VANCOPEAK, VANCORANDOM, GENTTROUGH, GENTPEAK, GENTRANDOM, TOBRATROUGH, TOBRAPEAK, TOBRARND, AMIKACINPEAK, AMIKACINTROU, AMIKACIN in the last 72 hours.   Microbiology: No results found for this or any previous visit (from the past 720 hour(s)).  Medical History: Past Medical History  Diagnosis Date  . Cancer ([place])   . Heart murmur   . Breast cancer ([place])     h/o    Medications:  Scheduled:  . aspirin EC  81 mg Oral Daily  . docusate sodium  100 mg Oral BID  . donepezil  10 mg Oral QHS  . feeding supplement (ENSURE ENLIVE)  237 mL Oral TID WC  . fluticasone  1 spray Each Nare Daily  . levofloxacin (LEVAQUIN) IV  750 mg Intravenous Q48H  . loratadine  10 mg Oral Daily  . memantine  28 mg Oral Daily  . metoprolol tartrate  25 mg Oral BID  . piperacillin-tazobactam (ZOSYN)  IV  3.375 g Intravenous 3 times per day  . sertraline  100 mg Oral Daily  . traZODone  50 mg Oral QHS   Assessment: Pt is a 81 year old female who presents with encepalopathy, abnormal urinalysis, and PNA. Pt is from a nursing home. Pharmacy consulted to dose levofloxacin. Pt also on zosyn  Goal of Therapy:  resoltuion of infection  Plan:  Follow up  culture results due to Crcl will start levofloxacin 750mg  q 48 hours  Sheryl Towell D Sumedha Munnerlyn 11/07/2015,4:08 PM

## 2015-11-07 NOTE — Progress Notes (Addendum)
TC to Dr. Margaretmary Eddy with pt returned to room to rest but pt continues to climb OOB/chair with all measures ineffective in supporting pt safety Seroquel and all safety interventions/diversional activities in effective.  Up to BR with 1+ with void.  Pt again started OOB and insisted on going to BR. Pt assisted to BR with void attempted. On the way back from the BR, pt became pale, generalized weakness with 2+ to get pt back to bed. No co's voiced/no pain. VSS. TC to telemetry with no changes in NSR HR 75. TC back to Dr. Margaretmary Eddy with reports of pt change of condition with weakness, like a near syncopal episode with VSS/Telemetry report given and states she will come check pt. Pt now exhibiting increased slurred speech/expressive aphasia/color pale/dry. Dgt,Sheila arrived and updated with dgt reporting current speech is variant from her normal.

## 2015-11-07 NOTE — Progress Notes (Signed)
Initial Nutrition Assessment   INTERVENTION:   Meals and Snacks: Cater to patient preferences; currently pt requiring assistance with meals per [facility] Medical Food Supplement Therapy: will recommend Ensure Enlive po TID, each supplement provides 350 kcal and 20 grams of protein   NUTRITION DIAGNOSIS:   Inadequate oral intake related to acute illness as evidenced by meal completion < 50%.  GOAL:   Patient will meet greater than or equal to 90% of their needs  MONITOR:    (Energy Intake, Electrolyte and renal Profile, Anthropometrics)  REASON FOR ASSESSMENT:   Malnutrition Screening Tool    ASSESSMENT:   Pt admitted with AMS and encephalopathy with possible stroke. Pt confused and impulsive this am.  Past Medical History  Diagnosis Date  . Cancer ([place])   . Heart murmur   . Breast cancer ([place])     h/o    Diet Order:  Diet Heart Room service appropriate?: Yes; Fluid consistency:: Thin    Current Nutrition: Pt very confused this am. Per CNA Amelia pt ate 10% of french toast and potatoes this am but too confused to continue to eat.   Food/Nutrition-Related History: Unable to clarify. Per MST no decrease in appetite.   Scheduled Medications:  . aspirin EC  81 mg Oral Daily  . docusate sodium  100 mg Oral BID  . donepezil  10 mg Oral QHS  . feeding supplement (ENSURE ENLIVE)  237 mL Oral TID WC  . fluticasone  1 spray Each Nare Daily  . loratadine  10 mg Oral Daily  . memantine  28 mg Oral Daily  . metoprolol tartrate  25 mg Oral BID  . sertraline  100 mg Oral Daily  . traZODone  50 mg Oral QHS    Continuous Medications:  . sodium chloride 100 mL/hr at 11/07/15 0313     Electrolyte/Renal Profile and Glucose Profile:   Recent Labs Lab 11/06/15 2124  NA 141  K 3.7  CL 107  CO2 27  BUN 14  CREATININE 0.93  CALCIUM 9.0  GLUCOSE 99   Protein Profile:   Recent Labs Lab 11/06/15 2124  ALBUMIN 3.7    Gastrointestinal Profile: Last BM:   unknown   Nutrition-Focused Physical Exam Findings:  Unable to complete Nutrition-Focused physical exam at this time.    Weight Change: Unable to clarify with pt. Per MST weight loss PTA. No trend per CHL.   Skin:  Reviewed, no issues   Height:   Ht Readings from Last 1 Encounters:  11/07/15 5\' 6"  (1.676 m)    Weight:   Wt Readings from Last 1 Encounters:  11/07/15 133 lb 3.2 oz (60.419 kg)    BMI:  Body mass index is 21.51 kg/(m^2).  Estimated Nutritional Needs:   Kcal:  BEE: 1070kcals, TEE: (IF 1.1-1.3)(AF 1.2) 1413-1670kcals  Protein:  48-66g protein (0.8-1.0g/kg)  Fluid:  1510-1820mL of fluid (25-13mL/kg)  EDUCATION NEEDS:   Education needs no appropriate at this time   [place], RD, LDN Pager [phone]

## 2015-11-07 NOTE — Progress Notes (Signed)
Dr. Margaretmary Eddy in to speak with family and advised pt had stroke. Code status updated per family request. Pt pale, alert, having same level of difficulty with slurred speech/dysarthia. Generalized weakness/more difficulty following commands. Will reassess as ativan and seroquel effects resolve. Family at bedside. Sitter needed for pt safety on next shift. No signs pain/discomfort. VSS.

## 2015-11-07 NOTE — H&P (Signed)
[name] is an 81 y.o. female.   Chief Complaint: Slurred speech HPI: The patient presents emergency department from her assisted living facility when staff noticed that she was weak on her right side, had a possible right facial droop as well as slurred speech. The patient denies complaints. Initial valuation emergency department revealed no focal neurologic deficits although the patient did not (or could not) cooperate with full neurologic exam. CT of the head revealed some questionable areas of attenuation left frontal parietal lobe as well as a remote lacunar infarct on the left. Also, chest x-ray showed some area of opacity in the base of the left lung which prompted emergency Department to call for admission.  Past Medical History  Diagnosis Date  . Cancer ([place])   . Heart murmur   . Breast cancer [facility])     h/o    Past Surgical History  Procedure Laterality Date  . Mastectomy  Rt side    1998  . Bladder tack    . Rotator cuff repair Right     Family History  Problem Relation Age of Onset  . Leukemia Sister   . CAD Father    Social History:  reports that she has never smoked. She does not have any smokeless tobacco history on file. She reports that she does not drink alcohol or use illicit drugs.  Allergies:  Allergies  Allergen Reactions  . Sulfa Antibiotics Hives    Prior to Admission medications   Medication Sig Start Date End Date Taking? Authorizing Provider  cetirizine (ZYRTEC) 10 MG tablet Take 10 mg by mouth daily as needed for allergies.   Yes Historical Provider, MD  donepezil (ARICEPT) 10 MG tablet Take 10 mg by mouth at bedtime.   Yes Historical Provider, MD  fluticasone (FLONASE) 50 MCG/ACT nasal spray Place 1 spray into both nostrils daily.   Yes Historical Provider, MD  memantine (NAMENDA XR) 28 MG CP24 24 hr capsule Take 28 mg by mouth daily.   Yes Historical Provider, MD  Menthol-Zinc Oxide (RISAMINE) 0.44-20.625 % OINT Apply 1 application topically as  needed (to affected areas).   Yes Historical Provider, MD  metoprolol tartrate (LOPRESSOR) 25 MG tablet Take 25 mg by mouth 2 (two) times daily.   Yes Historical Provider, MD  QUEtiapine (SEROQUEL) 200 MG tablet Take 200 mg by mouth 2 (two) times daily as needed (for agitation).   Yes Historical Provider, MD  sertraline (ZOLOFT) 100 MG tablet Take 100 mg by mouth daily.   Yes Historical Provider, MD  traZODone (DESYREL) 50 MG tablet Take 50 mg by mouth at bedtime.   Yes Historical Provider, MD     Results for orders placed or performed during the hospital encounter of 11/06/15 (from the past 48 hour(s))  Glucose, capillary     Status: None   Collection Time: 11/06/15  9:14 PM  Result Value Ref Range   Glucose-Capillary 86 65 - 99 mg/dL  CBC with Differential     Status: Abnormal   Collection Time: 11/06/15  9:24 PM  Result Value Ref Range   WBC 6.8 3.6 - 11.0 K/uL   RBC 3.80 3.80 - 5.20 MIL/uL   Hemoglobin 10.9 (L) 12.0 - 16.0 g/dL   HCT 33.2 (L) 35.0 - 47.0 %   MCV 87.4 80.0 - 100.0 fL   MCH 28.7 26.0 - 34.0 pg   MCHC 32.9 32.0 - 36.0 g/dL   RDW 13.5 11.5 - 14.5 %   Platelets 180 150 -  440 K/uL   Neutrophils Relative % 35 %   Neutro Abs 2.4 1.4 - 6.5 K/uL   Lymphocytes Relative 41 %   Lymphs Abs 2.8 1.0 - 3.6 K/uL   Monocytes Relative 23 %   Monocytes Absolute 1.5 (H) 0.2 - 0.9 K/uL   Eosinophils Relative 0 %   Eosinophils Absolute 0.0 0 - 0.7 K/uL   Basophils Relative 1 %   Basophils Absolute 0.0 0 - 0.1 K/uL  Comprehensive metabolic panel     Status: Abnormal   Collection Time: 11/06/15  9:24 PM  Result Value Ref Range   Sodium 141 135 - 145 mmol/L   Potassium 3.7 3.5 - 5.1 mmol/L   Chloride 107 101 - 111 mmol/L   CO2 27 22 - 32 mmol/L   Glucose, Bld 99 65 - 99 mg/dL   BUN 14 6 - 20 mg/dL   Creatinine, Ser 0.93 0.44 - 1.00 mg/dL   Calcium 9.0 8.9 - 10.3 mg/dL   Total Protein 6.9 6.5 - 8.1 g/dL   Albumin 3.7 3.5 - 5.0 g/dL   AST 12 (L) 15 - 41 U/L   ALT 9 (L) 14 -  54 U/L   Alkaline Phosphatase 75 38 - 126 U/L   Total Bilirubin 0.2 (L) 0.3 - 1.2 mg/dL   GFR calc non Af Amer 55 (L) >60 mL/min   GFR calc Af Amer >60 >60 mL/min    Comment: (NOTE) The eGFR has been calculated using the CKD EPI equation. This calculation has not been validated in all clinical situations. eGFR's persistently <60 mL/min signify possible Chronic Kidney Disease.    Anion gap 7 5 - 15  Lipase, blood     Status: None   Collection Time: 11/06/15  9:24 PM  Result Value Ref Range   Lipase 29 11 - 51 U/L  Troponin I     Status: None   Collection Time: 11/06/15  9:24 PM  Result Value Ref Range   Troponin I 0.03 <0.031 ng/mL    Comment:        NO INDICATION OF MYOCARDIAL INJURY.   Urinalysis complete, with microscopic ([facility] only)     Status: Abnormal   Collection Time: 11/06/15  9:51 PM  Result Value Ref Range   Color, Urine YELLOW (A) YELLOW   APPearance CLEAR (A) CLEAR   Glucose, UA NEGATIVE NEGATIVE mg/dL   Bilirubin Urine NEGATIVE NEGATIVE   Ketones, ur NEGATIVE NEGATIVE mg/dL   Specific Gravity, Urine 1.021 1.005 - 1.030   Hgb urine dipstick NEGATIVE NEGATIVE   pH 5.0 5.0 - 8.0   Protein, ur NEGATIVE NEGATIVE mg/dL   Nitrite NEGATIVE NEGATIVE   Leukocytes, UA 1+ (A) NEGATIVE   RBC / HPF 0-5 0 - 5 RBC/hpf   WBC, UA 6-30 0 - 5 WBC/hpf   Bacteria, UA NONE SEEN NONE SEEN   Squamous Epithelial / LPF 0-5 (A) NONE SEEN   Mucous PRESENT    Ct Head Wo Contrast  11/06/2015  CLINICAL DATA:  Confusion. Altered mental status. History of right breast cancer and right mastectomy. EXAM: CT HEAD WITHOUT CONTRAST TECHNIQUE: Contiguous axial images were obtained from the base of the skull through the vertex without intravenous contrast. COMPARISON:  None. FINDINGS: There is questionable loss of gray-white differentiation in the left frontotemporal region. There is a small low-density focus in the genu of the left internal capsule. No evidence of parenchymal hemorrhage or  extra-axial fluid collection. No mass lesion, mass effect, or midline shift.  Intracranial atherosclerosis. Nonspecific subcortical and periventricular white matter hypodensity, most in keeping with chronic small vessel ischemic change. There is diffuse cerebral volume loss. No ventriculomegaly. The visualized paranasal sinuses are essentially clear. The mastoid air cells are unopacified. No evidence of calvarial fracture. IMPRESSION: 1. Questionable loss of gray-white differentiation in the left frontotemporal region, cannot exclude an acute stroke in this location. If clinically warranted, consider further evaluation with brain MRI. 2. Small low-density focus in the genu of the left internal capsule, likely a remote lacunar infarct. 3. Intracranial atherosclerosis, diffuse cerebral volume loss and chronic small vessel ischemic white matter change. These results were called by telephone at the time of interpretation on 11/06/2015 at 9:50 pm to Dr. Delman Kitten , who verbally acknowledged these results. Electronically Signed   By: Ilona Sorrel M.D.   On: 11/06/2015 21:54   Dg Chest Port 1 View  11/06/2015  CLINICAL DATA:  Altered mental status. EXAM: PORTABLE CHEST 1 VIEW COMPARISON:  None. FINDINGS: 2115 hours. There are low lung volumes with patchy left-greater-than-right basilar airspace opacities. The heart size and mediastinal contours are unremarkable for the degree of inspiration. There is mild aortic atherosclerosis. No pneumothorax or significant pleural effusion identified. There are mild degenerative changes at both shoulders. IMPRESSION: Low lung volumes with patchy bibasilar opacities, possibly atelectasis. Early aspiration cannot be excluded, especially at the left base. Electronically Signed   By: Richardean Sale M.D.   On: 11/06/2015 21:24    Review of Systems  Constitutional: Negative for fever and chills.  HENT: Negative for sore throat and tinnitus.   Eyes: Negative for blurred vision and  redness.  Respiratory: Negative for cough and shortness of breath.   Cardiovascular: Negative for chest pain, palpitations, orthopnea and PND.  Gastrointestinal: Negative for nausea, vomiting, abdominal pain and diarrhea.  Genitourinary: Negative for dysuria, urgency and frequency.  Musculoskeletal: Negative for myalgias and joint pain.  Skin: Negative for rash.       No lesions  Neurological: Positive for speech change. Negative for focal weakness and weakness.  Endo/Heme/Allergies: Does not bruise/bleed easily.       No temperature intolerance  Psychiatric/Behavioral: Negative for depression and suicidal ideas.    Blood pressure 136/69, pulse 60, temperature 97.9 F (36.6 C), temperature source Oral, resp. rate 14, height 5' 4"  (1.626 m), weight 60.374 kg (133 lb 1.6 oz), SpO2 96 %. Physical Exam  Vitals reviewed. Constitutional: She is oriented to person, place, and time. She appears well-developed and well-nourished. No distress.  HENT:  Head: Normocephalic and atraumatic.  Mouth/Throat: Oropharynx is clear and moist.  Eyes: Conjunctivae and EOM are normal. Pupils are equal, round, and reactive to light. No scleral icterus.  Neck: Normal range of motion. Neck supple. No JVD present. No tracheal deviation present. No thyromegaly present.  Cardiovascular: Normal rate, regular rhythm and normal heart sounds.  Exam reveals no gallop and no friction rub.   No murmur heard. Respiratory: Effort normal and breath sounds normal.  GI: Soft. Bowel sounds are normal. She exhibits no distension. There is no tenderness.  Genitourinary:  Deferred  Lymphadenopathy:    She has no cervical adenopathy.  Neurological: She is alert and oriented to person, place, and time. No cranial nerve deficit. She exhibits normal muscle tone. She displays Babinski's sign on the right side.  The patient is not completely cooperative with full neurologic exam  Skin: Skin is warm and dry. No rash noted. No  erythema.  Psychiatric: She has a normal mood  and affect. Her behavior is normal. Judgment and thought content normal.     Assessment/Plan This is an 81 year old Caucasian female admitted for encephalopathy and possible stroke. 1. Encephalopathy: She does not always answer questions clearly although she is alert. I concur with the family that she has some slurred speech, but at the time of my physical exam the patient does not have any weakness or discernible facial droop. However, there is an upgoing Babinski reflex on the right. Emergency department staff has discussed with neurologist on call starting aspirin and ordering an MRI for the morning. I will place a neurology consult for the morning. 2. CVA: Possible; the patient has not suffered any falls. See above 3. Dementia: Continue Aricept and Namenda; Seroquel as needed and trazodone daily at bedtime per home regimen 4. Depression: Continue Zoloft  5. Lung opacity: The patient has no clinical signs or symptoms of pneumonia. 6. DVT prophylaxis: SCDs 7. GI prophylaxis: None The patient is a DO NOT RESUSCITATE. Time spent on admission was inpatient care proximally 45 minutes  Harrie Foreman 11/07/2015, 1:36 AM

## 2015-11-07 NOTE — Plan of Care (Signed)
Problem: Education: Goal: Knowledge of [facility] Education information/materials will improve Outcome: Completed/Met Date Met:  11/07/15 With family; pt unable due to limited cognition.  Problem: Education: Goal: Knowledge of disease or condition will improve Outcome: 61 Pt newly diagnosed with stroke with MRI + with MD in to speak with family.  Stroke Education Handbook given and discussed. US carotids and Cardiac ECHO studies completed. Exhausted all safety interventions before medication; pt moved to [address] closer to nurses station; needs 1:1 for safety at this time.  Advised family of safety needs/issues/interventions. Family will stay with pt until tonight with sitter needed for safety tonight with pt impulsive, confused, generalized weak. Pt is hypotensived with standing. Pt less active since weak episode at 1154 after seroquel prn dose and ativan IV for MRI. Will monitor pt after sedating effects resolve. Pt has less appetite and not interested in eating lunch/dinner. Ate breakfast/snacks this morning without difficulty. Family educated on aspiration precautions and will monitor closely.

## 2015-11-08 DIAGNOSIS — G309 Alzheimer's disease, unspecified: Secondary | ICD-10-CM | POA: Diagnosis present

## 2015-11-08 DIAGNOSIS — Y95 Nosocomial condition: Secondary | ICD-10-CM | POA: Diagnosis present

## 2015-11-08 DIAGNOSIS — R4781 Slurred speech: Secondary | ICD-10-CM | POA: Diagnosis present

## 2015-11-08 DIAGNOSIS — I951 Orthostatic hypotension: Secondary | ICD-10-CM | POA: Diagnosis present

## 2015-11-08 DIAGNOSIS — Z7982 Long term (current) use of aspirin: Secondary | ICD-10-CM | POA: Diagnosis not present

## 2015-11-08 DIAGNOSIS — N3000 Acute cystitis without hematuria: Secondary | ICD-10-CM | POA: Diagnosis present

## 2015-11-08 DIAGNOSIS — F0390 Unspecified dementia without behavioral disturbance: Secondary | ICD-10-CM | POA: Diagnosis present

## 2015-11-08 DIAGNOSIS — I63512 Cerebral infarction due to unspecified occlusion or stenosis of left middle cerebral artery: Secondary | ICD-10-CM | POA: Diagnosis present

## 2015-11-08 DIAGNOSIS — I639 Cerebral infarction, unspecified: Secondary | ICD-10-CM | POA: Diagnosis present

## 2015-11-08 DIAGNOSIS — R29810 Facial weakness: Secondary | ICD-10-CM | POA: Diagnosis present

## 2015-11-08 DIAGNOSIS — G9340 Encephalopathy, unspecified: Secondary | ICD-10-CM | POA: Diagnosis not present

## 2015-11-08 DIAGNOSIS — F329 Major depressive disorder, single episode, unspecified: Secondary | ICD-10-CM | POA: Diagnosis present

## 2015-11-08 DIAGNOSIS — J189 Pneumonia, unspecified organism: Secondary | ICD-10-CM | POA: Diagnosis present

## 2015-11-08 DIAGNOSIS — F0150 Vascular dementia without behavioral disturbance: Secondary | ICD-10-CM | POA: Diagnosis present

## 2015-11-08 DIAGNOSIS — Z79899 Other long term (current) drug therapy: Secondary | ICD-10-CM | POA: Diagnosis not present

## 2015-11-08 DIAGNOSIS — F0280 Dementia in other diseases classified elsewhere without behavioral disturbance: Secondary | ICD-10-CM | POA: Diagnosis present

## 2015-11-08 DIAGNOSIS — Z66 Do not resuscitate: Secondary | ICD-10-CM | POA: Diagnosis present

## 2015-11-08 DIAGNOSIS — K922 Gastrointestinal hemorrhage, unspecified: Secondary | ICD-10-CM | POA: Diagnosis present

## 2015-11-08 DIAGNOSIS — Z853 Personal history of malignant neoplasm of breast: Secondary | ICD-10-CM | POA: Diagnosis not present

## 2015-11-08 DIAGNOSIS — Z882 Allergy status to sulfonamides status: Secondary | ICD-10-CM | POA: Diagnosis not present

## 2015-11-08 DIAGNOSIS — Z9011 Acquired absence of right breast and nipple: Secondary | ICD-10-CM | POA: Diagnosis not present

## 2015-11-08 LAB — LIPID PANEL
CHOL/HDL RATIO: 6 ratio
CHOLESTEROL: 191 mg/dL (ref 0–200)
HDL: 32 mg/dL — ABNORMAL LOW (ref >40)
LDL Cholesterol: 144 mg/dL — ABNORMAL HIGH (ref 0–99)
Triglycerides: 74 mg/dL (ref <150)
VLDL: 15 mg/dL (ref 0–40)

## 2015-11-08 MED ORDER — PIPERACILLIN-TAZOBACTAM 3.375 G IVPB 30 MIN: 3.3750 g | Freq: Three times a day (TID) | INTRAVENOUS | Status: DC

## 2015-11-08 MED ORDER — SODIUM CHLORIDE 0.9 % IJ SOLN
3.0000 mL | INTRAMUSCULAR | Status: DC | PRN
  Administered 2015-11-08 – 2015-11-09 (×2): 3 mL via INTRAVENOUS
  Filled 2015-11-08: qty 10

## 2015-11-08 MED ORDER — PIPERACILLIN-TAZOBACTAM 3.375 G IVPB
3.3750 g | Freq: Three times a day (TID) | INTRAVENOUS | Status: AC
  Administered 2015-11-08 – 2015-11-10 (×7): 3.375 g via INTRAVENOUS
  Filled 2015-11-08 (×8): qty 50

## 2015-11-08 MED ORDER — PRAVASTATIN SODIUM 40 MG PO TABS
40.0000 mg | ORAL_TABLET | Freq: Every day | ORAL | Status: DC
  Administered 2015-11-08 – 2015-11-11 (×3): 40 mg via ORAL
  Filled 2015-11-08 (×4): qty 1

## 2015-11-08 MED ORDER — PIPERACILLIN-TAZOBACTAM 3.375 G IVPB
3.3750 g | Freq: Three times a day (TID) | INTRAVENOUS | Status: DC
  Filled 2015-11-08 (×2): qty 50

## 2015-11-08 NOTE — Progress Notes (Signed)
[facility] [place] NAME: [name]    MR#:  [id number]  DATE OF BIRTH:  1928/07/01  SUBJECTIVE:  CHIEF COMPLAINT:  Patient has ch dementia and hallucinates per daughter. Per daughter says  pt is almost close to her baseline  REVIEW OF SYSTEMS:  Unobtainable because of the altered mental status  DRUG ALLERGIES:   Allergies  Allergen Reactions  . Sulfa Antibiotics Hives    VITALS:  Blood pressure 144/66, pulse 77, temperature 98.7 F (37.1 C), temperature source Oral, resp. rate 18, height 5\' 6"  (1.676 m), weight 60.963 kg (134 lb 6.4 oz), SpO2 99 %.  PHYSICAL EXAMINATION:  GENERAL:  81 y.o.-year-old patient lying in the bed with no acute distress.  EYES: Pupils equal, round, reactive to light and accommodation. No scleral icterus. Extraocular muscles grossly intact.  HEENT: Head atraumatic, normocephalic. Oropharynx and nasopharynx clear.  NECK:  Supple, no jugular venous distention. No thyroid enlargement, no tenderness.  LUNGS: Normal breath sounds bilaterally, no wheezing, rales,rhonchi or crepitation. No use of accessory muscles of respiration.  CARDIOVASCULAR: S1, S2 normal. No murmurs, rubs, or gallops.  ABDOMEN: Soft, nontender, nondistended. Bowel sounds present. No organomegaly or mass.  EXTREMITIES: No pedal edema, cyanosis, or clubbing.  NEUROLOGIC: Cranial nerves II through XII are grossly intact. Muscle strength 4/5 on rt side, at her baseline on lt side, followa few verbal commands . Sensation intact. Gait not checked. Dysarthric speech PSYCHIATRIC: The patient is alert and disoriented SKIN: No obvious rash, lesion, or ulcer.    LABORATORY PANEL:   CBC  Recent Labs Lab 11/06/15 2124  WBC 6.8  HGB 10.9*  HCT 33.2*  PLT 180   ------------------------------------------------------------------------------------------------------------------  Chemistries   Recent Labs Lab 11/06/15 2124  NA 141  K  3.7  CL 107  CO2 27  GLUCOSE 99  BUN 14  CREATININE 0.93  CALCIUM 9.0  AST 12*  ALT 9*  ALKPHOS 75  BILITOT 0.2*   ------------------------------------------------------------------------------------------------------------------  Cardiac Enzymes  Recent Labs Lab 11/06/15 2124  TROPONINI 0.03   ------------------------------------------------------------------------------------------------------------------  RADIOLOGY:  Ct Head Wo Contrast  11/06/2015  CLINICAL DATA:  Confusion. Altered mental status. History of right breast cancer and right mastectomy. EXAM: CT HEAD WITHOUT CONTRAST TECHNIQUE: Contiguous axial images were obtained from the base of the skull through the vertex without intravenous contrast. COMPARISON:  None. FINDINGS: There is questionable loss of gray-white differentiation in the left frontotemporal region. There is a small low-density focus in the genu of the left internal capsule. No evidence of parenchymal hemorrhage or extra-axial fluid collection. No mass lesion, mass effect, or midline shift. Intracranial atherosclerosis. Nonspecific subcortical and periventricular white matter hypodensity, most in keeping with chronic small vessel ischemic change. There is diffuse cerebral volume loss. No ventriculomegaly. The visualized paranasal sinuses are essentially clear. The mastoid air cells are unopacified. No evidence of calvarial fracture. IMPRESSION: 1. Questionable loss of gray-white differentiation in the left frontotemporal region, cannot exclude an acute stroke in this location. If clinically warranted, consider further evaluation with brain MRI. 2. Small low-density focus in the genu of the left internal capsule, likely a remote lacunar infarct. 3. Intracranial atherosclerosis, diffuse cerebral volume loss and chronic small vessel ischemic white matter change. These results were called by telephone at the time of interpretation on 11/06/2015 at 9:50 pm to Dr. Delman Kitten , who verbally acknowledged these results. Electronically Signed   By: Ilona Sorrel M.D.   On: 11/06/2015 21:54  Mr Brain Wo Contrast  11/07/2015  CLINICAL DATA:  Patient presents from assisted living facility with right-sided weakness, slurred speech, and RIGHT facial droop. Symptom duration unspecified. EXAM: MRI HEAD WITHOUT CONTRAST TECHNIQUE: Multiplanar, multiecho pulse sequences of the brain and surrounding structures were obtained without intravenous contrast. COMPARISON:  CT head earlier in the day. FINDINGS: The patient was unable to remain motionless for the exam. Small or subtle lesions could be overlooked. There is a moderate-sized area restricted diffusion, affecting much of the LEFT frontal opercular cortex and a small region of the superior LEFT insula consistent with acute infarction. This correlates with the visualized CT abnormality of cytotoxic edema. This likely represents a LEFT M4 branch occlusion. No hemorrhage, mass lesion, hydrocephalus, or extra-axial fluid. Advanced cerebral and cerebellar atrophy. Extensive T2 and FLAIR hyperintensities throughout the periventricular and subcortical white matter representing chronic microvascular ischemic change. Flow voids are maintained in the carotid, basilar, and vertebral arteries. There is no obvious large vessel occlusion affecting the proximal LEFT MCA, but there is too much motion degradation to assess for the presence of a and MCA branch thrombosis. No obvious midline abnormality. Extracranial soft tissues grossly unremarkable. The other area questioned on CT, posterior caudate/genu internal capsule, represents a chronic lacunar infarct. IMPRESSION: Moderate-sized acute LEFT MCA infarction affecting much of the LEFT frontal operculum. No evidence for hemorrhage. Atrophy and small vessel disease. Electronically Signed   By: Staci Righter M.D.   On: 11/07/2015 16:11   US Carotid Bilateral  11/07/2015  CLINICAL DATA:  Acute left MCA  infarct.  Dizziness. EXAM: BILATERAL CAROTID DUPLEX ULTRASOUND TECHNIQUE: Pearline Cables scale imaging, color Doppler and duplex ultrasound were performed of bilateral carotid and vertebral arteries in the neck. COMPARISON:  None. FINDINGS: Criteria: Quantification of carotid stenosis is based on velocity parameters that correlate the residual internal carotid diameter with NASCET-based stenosis levels, using the diameter of the distal internal carotid lumen as the denominator for stenosis measurement. The following velocity measurements were obtained: RIGHT ICA:  40 cm/sec CCA:  66 cm/sec SYSTOLIC ICA/CCA RATIO:  0.6 DIASTOLIC ICA/CCA RATIO:  1.0 ECA:  50 cm/sec LEFT ICA:  43 cm/sec CCA:  62 cm/sec SYSTOLIC ICA/CCA RATIO:  0.7 DIASTOLIC ICA/CCA RATIO:  0.8 ECA:  55 cm/sec RIGHT CAROTID ARTERY: No appreciable plaque at the right carotid bulb or bifurcation on grayscale imaging. No hemodynamically significant right internal carotid artery stenosis by Doppler criteria. RIGHT VERTEBRAL ARTERY:  Normal antegrade flow. LEFT CAROTID ARTERY: Minimal partially calcified plaque at the left carotid bulb and bifurcation, with less than 20% proximal left internal carotid artery stenosis by grayscale imaging. No hemodynamically significant left internal carotid artery stenosis by Doppler criteria. LEFT VERTEBRAL ARTERY:  Normal antegrade flow. IMPRESSION: 1. Minimal left and no appreciable right carotid plaque, with no hemodynamically significant internal carotid stenosis (0- 49%). 2. Normal antegrade flow in both vertebral arteries. Electronically Signed   By: Ilona Sorrel M.D.   On: 11/07/2015 16:45   Dg Chest Port 1 View  11/06/2015  CLINICAL DATA:  Altered mental status. EXAM: PORTABLE CHEST 1 VIEW COMPARISON:  None. FINDINGS: 2115 hours. There are low lung volumes with patchy left-greater-than-right basilar airspace opacities. The heart size and mediastinal contours are unremarkable for the degree of inspiration. There is mild  aortic atherosclerosis. No pneumothorax or significant pleural effusion identified. There are mild degenerative changes at both shoulders. IMPRESSION: Low lung volumes with patchy bibasilar opacities, possibly atelectasis. Early aspiration cannot be excluded, especially at the left base. Electronically  Signed   By: Richardean Sale M.D.   On: 11/06/2015 21:24    EKG:   Orders placed or performed during the hospital encounter of 11/06/15  . ED EKG  . ED EKG    ASSESSMENT AND PLAN:   This is an 81 year old Caucasian female admitted for encephalopathy and possible stroke.  #. Encephalopathy: Could be from CVA/acute cystitis/pneumonia Patient is altered, has baseline Alzheimer's dementia  #. 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,  carotid Dopplers and 2-D echocardiogram nmL Appreciate Neurology recommendations As per RN report pt passed  bedside swallow evaluation  fasting lipid panel - LDL at 144,  started patient on statin Continue aspirin PT recommended home PT  #Abnormal urinalysis  urine culture ntd  on IV levofloxacin for possible UTI  #Pneumonia Will put the patient on aspiration precautions Treat her for healthcare associated pneumonia with Zosyn and levofloxacin as patient is from a healthcare facility We'll get sputum culture and sensitivity Rpt cxr in am  #Orthostatic hypotension Gentle hydration with IV fluids provided  #. Dementia: Continue Aricept and Namenda; Seroquel as needed and trazodone daily at bedtime per home regimen  #. Depression: Continue Zoloft      #D VT prophylaxis: SCDs    The patient is a DO NOT RESUSCITATE.       All the records are reviewed and case discussed with Care Management/Social Workerr. Management plans discussed with the patient, daughter at bedside  and she is in in agreement.  CODE STATUS: DO NOT RESUSCITATE  TOTAL TIME TAKING CARE OF THIS PATIENT: 35 minutes.   POSSIBLE D/C  IN 1-2 DAYS, DEPENDING ON CLINICAL CONDITION.   Nicholes Mango M.D on 11/08/2015 at 7:24 PM  Between 7am to 6pm - Pager - [phone] After 6pm go to [email or web address] - password [email or web address] [facility]  [facility]  Office  [phone]  CC: Primary care physician; Mathews Argyle, MD

## 2015-11-08 NOTE — Plan of Care (Signed)
Problem: Nutrition: Goal: Risk of aspiration will decrease Outcome: Progressing Pt has been very restless during shift. Pt was given seroquel and pt did not settle down until few hours after administration. Pt is currently sleeping. IV abx, was unable to assess neuro because pt was not following commands. No other signs of distress noted. Will continue to monitor.

## 2015-11-08 NOTE — Clinical Social Work Note (Signed)
Clinical Social Work Assessment  Patient Details  Name: [name] MRN: [id number] Date of Birth: 1928-07-25  Date of referral:  11/08/15               Reason for consult:   (From [facility])                Permission sought to share information with:  Facility Sport and exercise psychologist, Family Supports Permission granted to share information::     Name::        Agency::     Relationship::     Contact Information:     Housing/Transportation Living arrangements for the past 2 months:  Center Line of Information:  Medical Team, Adult Children Patient Interpreter Needed:  None Criminal Activity/Legal Involvement Pertinent to Current Situation/Hospitalization:  No - Comment as needed Significant Relationships:  Adult Children, Warehouse manager Lives with:  Facility Resident Do you feel safe going back to the place where you live?  Yes (per daughter) Need for family participation in patient care:  Yes (Comment)  Care giving concerns:  None at this time   Facilities manager / plan:  Holiday representative ([place]) consult, patient is from [employer].   Patient's baseline is confused, however she was pleasant and attempting to engage in conversation with CSW.  (Additioan Information provided by her daughter Olivia Mackie [phone] & wk # [phone], states she is HPOA).  CSW introduced self and explained role of CSW department.  Patient has lived at [facility] for about 13 months and daughter plans for her to return at discharge.   Patient's support system are her three daughters. Per Olivia Mackie she will transport patient back to [facility] when she is medically ready to discharge.   Please call her at work [phone].     CSW will complete FL2, and contact facility in anticipation of patient returning to facility at discharge.       Employment status:  Disabled (Comment on whether or not currently receiving Disability) Insurance information:  Managed  Medicare PT Recommendations:  Not assessed at this time Information / Referral to community resources:   (none at this time)  Patient/Family's Response to care:  Patient's daughter was appreciative of talking with CSW.   Patient/Family's Understanding of and Emotional Response to Diagnosis, Current Treatment, and Prognosis:  Patient's daughter  understands that patient is under continued medical work up at this time.  Once medically stable she will discharge back to [facility].  Emotional Assessment Appearance:  Appears stated age Attitude/Demeanor/Rapport:  Combative Affect (typically observed):  Pleasant, Restless, Agitated Orientation:  Oriented to Self Alcohol / Substance use:  Never Used Psych involvement (Current and /or in the community):  No (Comment)  Discharge Needs  Concerns to be addressed:  Care Coordination, Discharge Planning Concerns Readmission within the last 30 days:  No Current discharge risk:  Chronically ill Barriers to Discharge:  Continued Medical Work up   Maurine Cane, LCSW 11/08/2015, 4:51 PM

## 2015-11-08 NOTE — Plan of Care (Signed)
Problem: Education: Goal: Knowledge of disease or condition will improve Outcome: Progressing PT consult done today. Pt ambulated with slightly unsteady gait; pt reluctant to follow directions complicated by directions, For ex- didn't want to go back in her room; wanted to go somewhere else. Pt had to be persuaded to go back in room by family and PT, nurse tech. Pt requires safety sitter 1:1 due impulsivity and unsteady gain/confusion.  Problem: Nutrition: Goal: Risk of aspiration will decrease Outcome: Progressing Eats diet slowly. Pt clears throat and nurse which family reports this is pt's baseline for years. No other sign/symptom swallowing. Appetite better today.

## 2015-11-08 NOTE — Consult Note (Signed)
CC: confusion/disorientation   HPI: [name] is an 81 y.o. female with history of long term dementia.  At baseline she doesn't know date, time, location. There was suspicion that pt was weak on R side with R facial droop.  No focal weakness this AM.  CTH no acute abnormality  L MCA stroke on MRI  Past Medical History  Diagnosis Date  . Cancer ([place])   . Heart murmur   . Breast cancer [facility])     h/o    Past Surgical History  Procedure Laterality Date  . Mastectomy  Rt side    1998  . Bladder tack    . Rotator cuff repair Right     Family History  Problem Relation Age of Onset  . Leukemia Sister   . CAD Father     Social History:  reports that she has never smoked. She does not have any smokeless tobacco history on file. She reports that she does not drink alcohol or use illicit drugs.  Allergies  Allergen Reactions  . Sulfa Antibiotics Hives    Medications: I have reviewed the patient's current medications.  ROS: Not able to obtain due to history of dementia.    Physical Examination: Blood pressure 147/67, pulse 68, temperature 98.6 F (37 C), temperature source Oral, resp. rate 18, height 5\' 6"  (1.676 m), weight 134 lb 6.4 oz (60.963 kg), SpO2 98 %.    Neurological Examination Mental Status: Alert to name, able to state her first name.  Cranial Nerves: II: Discs flat bilaterally; Visual fields grossly normal, pupils equal, round, reactive to light and accommodation III,IV, VI: ptosis not present, extra-ocular motions intact bilaterally V,VII: smile symmetric, facial light touch sensation normal bilaterally VIII: hearing normal bilaterally IX,X: gag reflex present XI: bilateral shoulder shrug XII: midline tongue extension Motor: Right : Upper extremity   4+/5    Left:     Upper extremity   4+/5  Lower extremity   4+/5     Lower extremity   5/5 Tone and bulk:normal tone throughout; no atrophy noted Sensory: Pinprick and light touch intact throughout,  bilaterally Deep Tendon Reflexes: 1+ and symmetric throughout Plantars: Right: downgoing   Left: downgoing Cerebellar: Not tested  Gait: not tested       Laboratory Studies:   Basic Metabolic Panel:  Recent Labs Lab 11/06/15 2124  NA 141  K 3.7  CL 107  CO2 27  GLUCOSE 99  BUN 14  CREATININE 0.93  CALCIUM 9.0    Liver Function Tests:  Recent Labs Lab 11/06/15 2124  AST 12*  ALT 9*  ALKPHOS 75  BILITOT 0.2*  PROT 6.9  ALBUMIN 3.7    Recent Labs Lab 11/06/15 2124  LIPASE 29   No results for input(s): AMMONIA in the last 168 hours.  CBC:  Recent Labs Lab 11/06/15 2124  WBC 6.8  NEUTROABS 2.4  HGB 10.9*  HCT 33.2*  MCV 87.4  PLT 180    Cardiac Enzymes:  Recent Labs Lab 11/06/15 2124  TROPONINI 0.03    BNP: Invalid input(s): POCBNP  CBG:  Recent Labs Lab 11/06/15 2114  GLUCAP 75    Microbiology: Results for orders placed or performed during the hospital encounter of 11/06/15  Urine culture     Status: None (Preliminary result)   Collection Time: 11/06/15  9:51 PM  Result Value Ref Range Status   Specimen Description URINE, CLEAN CATCH  Final   Special Requests NONE  Final   Culture  NO GROWTH < 24 HOURS  Final   Report Status PENDING  Incomplete    Coagulation Studies: No results for input(s): LABPROT, INR in the last 72 hours.  Urinalysis:   Recent Labs Lab 11/06/15 2151  COLORURINE YELLOW*  LABSPEC 1.021  PHURINE 5.0  GLUCOSEU NEGATIVE  HGBUR NEGATIVE  BILIRUBINUR NEGATIVE  KETONESUR NEGATIVE  PROTEINUR NEGATIVE  NITRITE NEGATIVE  LEUKOCYTESUR 1+*    Lipid Panel:     Component Value Date/Time   CHOL 191 11/08/2015 0504   TRIG 74 11/08/2015 0504   HDL 32* 11/08/2015 0504   CHOLHDL 6.0 11/08/2015 0504   VLDL 15 11/08/2015 0504   LDLCALC 144* 11/08/2015 0504    HgbA1C: No results found for: HGBA1C  Urine Drug Screen:  No results found for: LABOPIA, COCAINSCRNUR, LABBENZ, AMPHETMU, THCU, LABBARB   Alcohol Level: No results for input(s): ETH in the last 168 hours.   Ct Head Wo Contrast  11/06/2015  CLINICAL DATA:  Confusion. Altered mental status. History of right breast cancer and right mastectomy. EXAM: CT HEAD WITHOUT CONTRAST TECHNIQUE: Contiguous axial images were obtained from the base of the skull through the vertex without intravenous contrast. COMPARISON:  None. FINDINGS: There is questionable loss of gray-white differentiation in the left frontotemporal region. There is a small low-density focus in the genu of the left internal capsule. No evidence of parenchymal hemorrhage or extra-axial fluid collection. No mass lesion, mass effect, or midline shift. Intracranial atherosclerosis. Nonspecific subcortical and periventricular white matter hypodensity, most in keeping with chronic small vessel ischemic change. There is diffuse cerebral volume loss. No ventriculomegaly. The visualized paranasal sinuses are essentially clear. The mastoid air cells are unopacified. No evidence of calvarial fracture. IMPRESSION: 1. Questionable loss of gray-white differentiation in the left frontotemporal region, cannot exclude an acute stroke in this location. If clinically warranted, consider further evaluation with brain MRI. 2. Small low-density focus in the genu of the left internal capsule, likely a remote lacunar infarct. 3. Intracranial atherosclerosis, diffuse cerebral volume loss and chronic small vessel ischemic white matter change. These results were called by telephone at the time of interpretation on 11/06/2015 at 9:50 pm to Dr. Delman Kitten , who verbally acknowledged these results. Electronically Signed   By: Ilona Sorrel M.D.   On: 11/06/2015 21:54   Mr Brain Wo Contrast  11/07/2015  CLINICAL DATA:  Patient presents from assisted living facility with right-sided weakness, slurred speech, and RIGHT facial droop. Symptom duration unspecified. EXAM: MRI HEAD WITHOUT CONTRAST TECHNIQUE: Multiplanar,  multiecho pulse sequences of the brain and surrounding structures were obtained without intravenous contrast. COMPARISON:  CT head earlier in the day. FINDINGS: The patient was unable to remain motionless for the exam. Small or subtle lesions could be overlooked. There is a moderate-sized area restricted diffusion, affecting much of the LEFT frontal opercular cortex and a small region of the superior LEFT insula consistent with acute infarction. This correlates with the visualized CT abnormality of cytotoxic edema. This likely represents a LEFT M4 branch occlusion. No hemorrhage, mass lesion, hydrocephalus, or extra-axial fluid. Advanced cerebral and cerebellar atrophy. Extensive T2 and FLAIR hyperintensities throughout the periventricular and subcortical white matter representing chronic microvascular ischemic change. Flow voids are maintained in the carotid, basilar, and vertebral arteries. There is no obvious large vessel occlusion affecting the proximal LEFT MCA, but there is too much motion degradation to assess for the presence of a and MCA branch thrombosis. No obvious midline abnormality. Extracranial soft tissues grossly unremarkable. The other  area questioned on CT, posterior caudate/genu internal capsule, represents a chronic lacunar infarct. IMPRESSION: Moderate-sized acute LEFT MCA infarction affecting much of the LEFT frontal operculum. No evidence for hemorrhage. Atrophy and small vessel disease. Electronically Signed   By: Staci Righter M.D.   On: 11/07/2015 16:11   US Carotid Bilateral  11/07/2015  CLINICAL DATA:  Acute left MCA infarct.  Dizziness. EXAM: BILATERAL CAROTID DUPLEX ULTRASOUND TECHNIQUE: Pearline Cables scale imaging, color Doppler and duplex ultrasound were performed of bilateral carotid and vertebral arteries in the neck. COMPARISON:  None. FINDINGS: Criteria: Quantification of carotid stenosis is based on velocity parameters that correlate the residual internal carotid diameter with  NASCET-based stenosis levels, using the diameter of the distal internal carotid lumen as the denominator for stenosis measurement. The following velocity measurements were obtained: RIGHT ICA:  40 cm/sec CCA:  66 cm/sec SYSTOLIC ICA/CCA RATIO:  0.6 DIASTOLIC ICA/CCA RATIO:  1.0 ECA:  50 cm/sec LEFT ICA:  43 cm/sec CCA:  62 cm/sec SYSTOLIC ICA/CCA RATIO:  0.7 DIASTOLIC ICA/CCA RATIO:  0.8 ECA:  55 cm/sec RIGHT CAROTID ARTERY: No appreciable plaque at the right carotid bulb or bifurcation on grayscale imaging. No hemodynamically significant right internal carotid artery stenosis by Doppler criteria. RIGHT VERTEBRAL ARTERY:  Normal antegrade flow. LEFT CAROTID ARTERY: Minimal partially calcified plaque at the left carotid bulb and bifurcation, with less than 20% proximal left internal carotid artery stenosis by grayscale imaging. No hemodynamically significant left internal carotid artery stenosis by Doppler criteria. LEFT VERTEBRAL ARTERY:  Normal antegrade flow. IMPRESSION: 1. Minimal left and no appreciable right carotid plaque, with no hemodynamically significant internal carotid stenosis (0- 49%). 2. Normal antegrade flow in both vertebral arteries. Electronically Signed   By: Ilona Sorrel M.D.   On: 11/07/2015 16:45   Dg Chest Port 1 View  11/06/2015  CLINICAL DATA:  Altered mental status. EXAM: PORTABLE CHEST 1 VIEW COMPARISON:  None. FINDINGS: 2115 hours. There are low lung volumes with patchy left-greater-than-right basilar airspace opacities. The heart size and mediastinal contours are unremarkable for the degree of inspiration. There is mild aortic atherosclerosis. No pneumothorax or significant pleural effusion identified. There are mild degenerative changes at both shoulders. IMPRESSION: Low lung volumes with patchy bibasilar opacities, possibly atelectasis. Early aspiration cannot be excluded, especially at the left base. Electronically Signed   By: Richardean Sale M.D.   On: 11/06/2015 21:24      Assessment/Plan:  [name] is an 81 y.o. female with history of long term dementia.  At baseline she doesn't know date, time, location. There was suspicion that pt was weak on R side with R facial droop.  No focal weakness this AM.  CTH no acute abnormality   MRI brain L MCA stroke that is affecting her speech.   Pt is on ASA and statin I don't believe pt is far off from her baseline Pt has progressive dementia likely with component of vascular dementia.   F/up neurology as out pt.  Family wants to follow up with Dr. Erlinda Hong at [facility].   D/w family at bedside Likely d/c tomorrow.   Leotis Pain

## 2015-11-08 NOTE — Plan of Care (Signed)
Problem: Education: Goal: Knowledge of disease or condition will improve Outcome: Progressing Dgt reports pt has returned to baseline other than speech. Pt was able to read a sentence, 3 words of 5, unable to recognize pictures today. Equal normal strength x 4 extremities. NIH at baseline yesterday a.m. Family very active and supportive. Neuro and Internist spoke with family today.

## 2015-11-08 NOTE — Evaluation (Signed)
Physical Therapy Evaluation Patient Details Name: [name] MRN: [id number] DOB: 1928/11/04 Today's Date: 11/08/2015   History of Present Illness  The patient presents emergency department from her assisted living facility when staff noticed that she was weak on her right side, had a possible right facial droop as well as slurred speech. The patient denies complaints. Initial valuation emergency department revealed no focal neurologic deficits although the patient did not (or could not) cooperate with full neurologic exam. CT of the head revealed some questionable areas of attenuation left frontal parietal lobe as well as a remote lacunar infarct on the left. Also, chest x-ray showed some area of opacity in the base of the left lung which prompted emergency . Pt and her husband were living at [facility].  Clinical Impression  Pt demonstrates some difficulty with controlling her impulsivity related to AMS from her medical issues.  She is willing to get up to move and will be walked by nursing staff as she had been yesterday, so PT will keep her on for strength and balance work as pt is willing and able to focus and participate.    Follow Up Recommendations Home health PT;Other (comment) (or ALF staff if PT is onsite)    Equipment Recommendations  None recommended by PT    Recommendations for Other Services       Precautions / Restrictions Precautions Precautions: Fall Precaution Comments: 1:1 sitter in room at all times Restrictions Weight Bearing Restrictions: No      Mobility  Bed Mobility Overal bed mobility: Modified Independent                Transfers Overall transfer level: Modified independent               General transfer comment: Pt is unsafe with her lines as she pulls IV and has no awareness of safety  Ambulation/Gait Ambulation/Gait assistance: Min guard Ambulation Distance (Feet): 300 Feet Assistive device: 1 person hand held assist (pushing  IV pole with pt and PT at times) Gait Pattern/deviations: Step-through pattern;Drifts right/left;Wide base of support;Shuffle Gait velocity: reduced Gait velocity interpretation: Below normal speed for age/gender    Stairs            Wheelchair Mobility    Modified Rankin (Stroke Patients Only)       Balance Overall balance assessment: Needs assistance Sitting-balance support: Feet supported Sitting balance-Leahy Scale: Fair     Standing balance support: Single extremity supported Standing balance-Leahy Scale: Poor Standing balance comment: Daughter reports pt looks the best using Majka rails at ALF but is given a walker to maneuver there.  Pt is walking at a more normal pace with hand hold on Aho rail or on IV pole                             Pertinent Vitals/Pain Pain Assessment: No/denies pain    Home Living Family/patient expects to be discharged to:: Skilled nursing facility                 Additional Comments: was at [facility] with her husband prior to admission    Prior Function Level of Independence: Needs assistance   Gait / Transfers Assistance Needed: has RW but per daughter walks too fast with it  ADL's / Homemaking Assistance Needed: in ALF  Comments: Pt has lived with husband who is helping monitor her status     Hand Dominance  Extremity/Trunk Assessment   Upper Extremity Assessment: Generalized weakness           Lower Extremity Assessment: Generalized weakness      Cervical / Trunk Assessment: Kyphotic  Communication   Communication: Expressive difficulties (nonsensical conversation)  Cognition Arousal/Alertness: Awake/alert Behavior During Therapy: Restless;Impulsive;Anxious Overall Cognitive Status: History of cognitive impairments - at baseline (not aware of being in hospital)       Memory: Decreased recall of precautions;Decreased short-term memory              General Comments       Exercises        Assessment/Plan    PT Assessment Patient needs continued PT services  PT Diagnosis Abnormality of gait;Altered mental status   PT Problem List Decreased strength;Decreased range of motion;Decreased activity tolerance;Decreased balance;Decreased mobility;Decreased coordination;Decreased cognition;Decreased knowledge of use of DME;Decreased safety awareness;Decreased knowledge of precautions  PT Treatment Interventions DME instruction;Gait training;Functional mobility training;Therapeutic activities;Therapeutic exercise;Balance training;Neuromuscular re-education;Cognitive remediation;Patient/family education   PT Goals (Current goals can be found in the Care Plan section) Acute Rehab PT Goals Patient Stated Goal: pt not able to state goal PT Goal Formulation: With family Time For Goal Achievement: 11/22/15 Potential to Achieve Goals: Good    Frequency Min 2X/week   Barriers to discharge Other (comment) (ALF will be able to take her)      Co-evaluation               End of Session Equipment Utilized During Treatment: Gait belt Activity Tolerance: Patient tolerated treatment well;Other (comment) (limit is her impulsivity to try to abandon the IV and leave) Patient left: in bed;with call bell/phone within reach;with bed alarm set;with nursing/sitter in room;with family/visitor present Nurse Communication: Mobility status;Other (comment) (safety issues)         Time: [id number] PT Time Calculation (min) (ACUTE ONLY): 31 min   Charges:   PT Evaluation $Initial PT Evaluation Tier I: 1 Procedure PT Treatments $Gait Training: 8-22 mins   PT G Codes:        Ramond Dial 11/16/2015, 5:34 PM   Mee Hives, PT MS Acute Rehab Dept. Number: [phone] [id number] and [place] [phone]

## 2015-11-08 NOTE — Evaluation (Signed)
Occupational Therapy Evaluation Patient Details Name: [name] MRN: [id number] DOB: 1928/11/06 Today's Date: 11/08/2015    History of Present Illness The patient presents emergency department from her assisted living facility when staff noticed that she was weak on her right side, had a possible right facial droop as well as slurred speech. The patient denies complaints. Initial valuation emergency department revealed no focal neurologic deficits although the patient did not (or could not) cooperate with full neurologic exam. CT of the head revealed some questionable areas of attenuation left frontal parietal lobe as well as a remote lacunar infarct on the left. Also, chest x-ray showed some area of opacity in the base of the left lung which prompted emergency . Pt and her husband were living at [facility].   Clinical Impression   Pt is 81 year old woman who came from [place] where her and her husband reside.  She has a history of dementia and is able to complete ADLs with min assist and mod cues for safety due to impulsivity and dementia with decreased awareness and safety.  Pt requires extra time for processing if task is not automatic.  She has a sitter 1:1 at all times due to being at risk for falls for impulsivity and dementia with deceased safety awareness.  Rec OT to address family ed and training for ADLs and safety before returning to [facility] or other DC placement.    Follow Up Recommendations  SNF (back to [facility])    Equipment Recommendations       Recommendations for Other Services       Precautions / Restrictions Precautions Precautions: Fall Precaution Comments: 1:1 sitter in room at all times Restrictions Weight Bearing Restrictions: No      Mobility Bed Mobility                  Transfers                      Balance                                            ADL Overall ADL's : Needs  assistance/impaired                                       General ADL Comments: Pt is able to complete ADLs with min assist and mod cues for safety due to impulsivity and dementia with decreased awareness and safety.  Pt requires extra time for processing if task is not automatic.     Vision     Perception     Praxis      Pertinent Vitals/Pain Pain Assessment: No/denies pain     Hand Dominance Left   Extremity/Trunk Assessment Upper Extremity Assessment Upper Extremity Assessment: Generalized weakness (intact AROM and fine motor skills BUE and hands but impulsive and impaired due to dementia)           Communication Communication Communication: Expressive difficulties (slurred speech, at times does not make sense with response to questions, delay in processing and response to questions)   Cognition Arousal/Alertness: Awake/alert Behavior During Therapy: Restless;Impulsive;Anxious Overall Cognitive Status: No family/caregiver present to determine baseline cognitive functioning (not oriented to place or time but knows name)  Memory: Decreased short-term memory             General Comments       Exercises       Shoulder Instructions      Home Living Family/patient expects to be discharged to:: Skilled nursing facility                                 Additional Comments: was at [facility] with her husband prior to admission      Prior Functioning/Environment Level of Independence: Needs assistance        Comments: Unclear as to how much assistance was needed for dementia since no family present.      OT Diagnosis: Generalized weakness;Cognitive deficits   OT Problem List: Decreased strength;Decreased activity tolerance;Decreased safety awareness;Decreased cognition   OT Treatment/Interventions: Self-care/ADL training;Patient/family education    OT Goals(Current goals can be found in the care plan section)  Acute Rehab OT Goals Patient Stated Goal: pt not able to state goal OT Goal Formulation: Patient unable to participate in goal setting Time For Goal Achievement: 11/22/15 Potential to Achieve Goals: Fair  OT Frequency: Min 1X/week   Barriers to D/C:            Co-evaluation              End of Session Nurse Communication:  (for clearance for participation in eval and status)  Activity Tolerance: Patient tolerated treatment well Patient left: in bed;with bed alarm set;with nursing/sitter in room   Time: 1136-1204 OT Time Calculation (min): 28 min Charges:  OT General Charges $OT Visit: 1 Procedure OT Evaluation $Initial OT Evaluation Tier I: 1 Procedure OT Treatments $Self Care/Home Management : 8-22 mins G-Codes:    Kashton Mcartor 11/15/15, 12:07 PM  Chrys Racer, OTR/L ascom [phone]

## 2015-11-09 ENCOUNTER — Inpatient Hospital Stay: Payer: Medicare Other

## 2015-11-09 LAB — URINE CULTURE: CULTURE: NO GROWTH

## 2015-11-09 LAB — CBC
HCT: 33.1 % — ABNORMAL LOW (ref 35.0–47.0)
Hemoglobin: 11.3 g/dL — ABNORMAL LOW (ref 12.0–16.0)
MCH: 29.6 pg (ref 26.0–34.0)
MCHC: 34.1 g/dL (ref 32.0–36.0)
MCV: 86.8 fL (ref 80.0–100.0)
PLATELETS: 179 10*3/uL (ref 150–440)
RBC: 3.81 MIL/uL (ref 3.80–5.20)
RDW: 13.1 % (ref 11.5–14.5)
WBC: 7.8 10*3/uL (ref 3.6–11.0)

## 2015-11-09 LAB — HEMOGLOBIN AND HEMATOCRIT, BLOOD
HCT: 31.4 % — ABNORMAL LOW (ref 35.0–47.0)
Hemoglobin: 10.4 g/dL — ABNORMAL LOW (ref 12.0–16.0)

## 2015-11-09 LAB — C DIFFICILE QUICK SCREEN W PCR REFLEX
C DIFFICILE (CDIFF) TOXIN: NEGATIVE
C Diff antigen: NEGATIVE
C Diff interpretation: NEGATIVE

## 2015-11-09 LAB — CREATININE, SERUM
CREATININE: 0.85 mg/dL (ref 0.44–1.00)
GFR calc Af Amer: >60 mL/min (ref >60)
GFR calc non Af Amer: >60 mL/min (ref >60)

## 2015-11-09 MED ORDER — TECHNETIUM TC 99M-LABELED RED BLOOD CELLS IV KIT
21.3100 | PACK | Freq: Once | INTRAVENOUS | Status: AC | PRN
  Administered 2015-11-09: 21.31 via INTRAVENOUS

## 2015-11-09 MED ORDER — PANTOPRAZOLE SODIUM 40 MG IV SOLR
40.0000 mg | INTRAVENOUS | Status: DC
  Administered 2015-11-09 – 2015-11-12 (×4): 40 mg via INTRAVENOUS
  Filled 2015-11-09 (×4): qty 40

## 2015-11-09 NOTE — Consult Note (Signed)
ANTIBIOTIC CONSULT NOTE - INITIAL  Pharmacy Consult for levofloxacin Indication: pneumonia  Allergies  Allergen Reactions  . Sulfa Antibiotics Hives    Patient Measurements: Height: 5\' 6"  (167.6 cm) Weight: 134 lb 11.2 oz (61.1 kg) IBW/kg (Calculated) : 59.3 Adjusted Body Weight:   Vital Signs: Temp: 97.5 F (36.4 C) (12/05 0847) Temp Source: Oral (12/05 0847) BP: 132/92 mmHg (12/05 0847) Pulse Rate: 77 (12/05 0847)  Labs:  Recent Labs  11/06/15 2124 11/09/15 0515 11/09/15 1317  WBC 6.8 7.8  --   HGB 10.9* 11.3* 10.4*  PLT 180 179  --   CREATININE 0.93 0.85  --    Estimated Creatinine Clearance: 46.1 mL/min (by C-G formula based on Cr of 0.85). No results for input(s): VANCOTROUGH, VANCOPEAK, VANCORANDOM, GENTTROUGH, GENTPEAK, GENTRANDOM, TOBRATROUGH, TOBRAPEAK, TOBRARND, AMIKACINPEAK, AMIKACINTROU, AMIKACIN in the last 72 hours.   Microbiology: Recent Results (from the past 720 hour(s))  Urine culture     Status: None   Collection Time: 11/06/15  9:51 PM  Result Value Ref Range Status   Specimen Description URINE, CLEAN CATCH  Final   Special Requests NONE  Final   Culture NO GROWTH 1 DAY  Final   Report Status 11/09/2015 FINAL  Final   Assessment: Pt is a 81 year old female who presents with encepalopathy, abnormal urinalysis, and PNA. Pt is from a nursing home. Pharmacy consulted to dose levofloxacin. Pt also on zosyn Day 3 of levaquin/zosyn - Urine culture NGTD  Goal of Therapy:  resoltuion of infection  Plan:  Will continue levaquin 750mg  IV Q48H based on renal function. Recommend de-escalation of antibiotics.   Legrande Hao C 11/09/2015,1:54 PM

## 2015-11-09 NOTE — Progress Notes (Signed)
[name] NAME: [name]    MR#:  [id number]  DATE OF BIRTH:  Jan 10, 1928  SUBJECTIVE:  CHIEF COMPLAINT:  Patient has ch dementia and hallucinates per daughter.  Patient is more awake and alert. Denies any abdominal pain but per nurse's report patient had bowel movement mixed with fresh blood since last night, also noticed black tarry stool  REVIEW OF SYSTEMS:  Unobtainable because of the altered mental status  DRUG ALLERGIES:   Allergies  Allergen Reactions  . Sulfa Antibiotics Hives    VITALS:  Blood pressure 132/92, pulse 77, temperature 97.5 F (36.4 C), temperature source Oral, resp. rate 18, height 5\' 6"  (1.676 m), weight 61.1 kg (134 lb 11.2 oz), SpO2 98 %.  PHYSICAL EXAMINATION:  GENERAL:  81 y.o.-year-old patient lying in the bed with no acute distress.  EYES: Pupils equal, round, reactive to light and accommodation. No scleral icterus. Extraocular muscles grossly intact.  HEENT: Head atraumatic, normocephalic. Oropharynx and nasopharynx clear.  NECK:  Supple, no jugular venous distention. No thyroid enlargement, no tenderness.  LUNGS: Normal breath sounds bilaterally, no wheezing, rales,rhonchi or crepitation. No use of accessory muscles of respiration.  CARDIOVASCULAR: S1, S2 normal. No murmurs, rubs, or gallops.  ABDOMEN: Soft, nontender, nondistended. Bowel sounds present. No organomegaly or mass.  EXTREMITIES: No pedal edema, cyanosis, or clubbing.  NEUROLOGIC: Cranial nerves are grossly intact. Muscle strength 4/5 on rt side, at her baseline on lt side, followa few verbal commands . Sensation intact. Gait not checked. Dysarthric speech PSYCHIATRIC: The patient is alert and disoriented SKIN: No obvious rash, lesion, or ulcer.    LABORATORY PANEL:   CBC  Recent Labs Lab 11/09/15 0515 11/09/15 1317  WBC 7.8  --   HGB 11.3* 10.4*  HCT 33.1* 31.4*  PLT 179  --     ------------------------------------------------------------------------------------------------------------------  Chemistries   Recent Labs Lab 11/06/15 2124 11/09/15 0515  NA 141  --   K 3.7  --   CL 107  --   CO2 27  --   GLUCOSE 99  --   BUN 14  --   CREATININE 0.93 0.85  CALCIUM 9.0  --   AST 12*  --   ALT 9*  --   ALKPHOS 75  --   BILITOT 0.2*  --    ------------------------------------------------------------------------------------------------------------------  Cardiac Enzymes  Recent Labs Lab 11/06/15 2124  TROPONINI 0.03   ------------------------------------------------------------------------------------------------------------------  RADIOLOGY:  Dg Chest 2 View  11/09/2015  CLINICAL DATA:  Shortness of breath, history of breast malignancy, previous CVA, nonsmoker. EXAM: CHEST  2 VIEW COMPARISON:  Portable chest x-ray of November 06, 2015 FINDINGS: The lungs are adequately inflated. There is no focal infiltrate. Increased lung markings in the retrocardiac region on the lateral image are noted. The interstitial markings are coarse bilaterally. There is apical pleural thickening bilaterally. The heart is mildly enlarged. The pulmonary vascularity is not engorged. There is mild tortuosity of the ascending and descending thoracic aorta. The observed bony thorax is unremarkable. IMPRESSION: Subsegmental atelectasis or early infiltrate likely in the left lower lobe. This is superimposed upon chronically increased interstitial markings and mild cardiomegaly. There is no overt evidence of pulmonary edema. Electronically Signed   By: David  Martinique M.D.   On: 11/09/2015 07:44   Mr Brain Wo Contrast  11/07/2015  CLINICAL DATA:  Patient presents from assisted living facility with right-sided weakness, slurred speech, and RIGHT facial droop. Symptom duration unspecified. EXAM: MRI  HEAD WITHOUT CONTRAST TECHNIQUE: Multiplanar, multiecho pulse sequences of the brain and  surrounding structures were obtained without intravenous contrast. COMPARISON:  CT head earlier in the day. FINDINGS: The patient was unable to remain motionless for the exam. Small or subtle lesions could be overlooked. There is a moderate-sized area restricted diffusion, affecting much of the LEFT frontal opercular cortex and a small region of the superior LEFT insula consistent with acute infarction. This correlates with the visualized CT abnormality of cytotoxic edema. This likely represents a LEFT M4 branch occlusion. No hemorrhage, mass lesion, hydrocephalus, or extra-axial fluid. Advanced cerebral and cerebellar atrophy. Extensive T2 and FLAIR hyperintensities throughout the periventricular and subcortical white matter representing chronic microvascular ischemic change. Flow voids are maintained in the carotid, basilar, and vertebral arteries. There is no obvious large vessel occlusion affecting the proximal LEFT MCA, but there is too much motion degradation to assess for the presence of a and MCA branch thrombosis. No obvious midline abnormality. Extracranial soft tissues grossly unremarkable. The other area questioned on CT, posterior caudate/genu internal capsule, represents a chronic lacunar infarct. IMPRESSION: Moderate-sized acute LEFT MCA infarction affecting much of the LEFT frontal operculum. No evidence for hemorrhage. Atrophy and small vessel disease. Electronically Signed   By: Staci Righter M.D.   On: 11/07/2015 16:11   US Carotid Bilateral  11/07/2015  CLINICAL DATA:  Acute left MCA infarct.  Dizziness. EXAM: BILATERAL CAROTID DUPLEX ULTRASOUND TECHNIQUE: Pearline Cables scale imaging, color Doppler and duplex ultrasound were performed of bilateral carotid and vertebral arteries in the neck. COMPARISON:  None. FINDINGS: Criteria: Quantification of carotid stenosis is based on velocity parameters that correlate the residual internal carotid diameter with NASCET-based stenosis levels, using the diameter  of the distal internal carotid lumen as the denominator for stenosis measurement. The following velocity measurements were obtained: RIGHT ICA:  40 cm/sec CCA:  66 cm/sec SYSTOLIC ICA/CCA RATIO:  0.6 DIASTOLIC ICA/CCA RATIO:  1.0 ECA:  50 cm/sec LEFT ICA:  43 cm/sec CCA:  62 cm/sec SYSTOLIC ICA/CCA RATIO:  0.7 DIASTOLIC ICA/CCA RATIO:  0.8 ECA:  55 cm/sec RIGHT CAROTID ARTERY: No appreciable plaque at the right carotid bulb or bifurcation on grayscale imaging. No hemodynamically significant right internal carotid artery stenosis by Doppler criteria. RIGHT VERTEBRAL ARTERY:  Normal antegrade flow. LEFT CAROTID ARTERY: Minimal partially calcified plaque at the left carotid bulb and bifurcation, with less than 20% proximal left internal carotid artery stenosis by grayscale imaging. No hemodynamically significant left internal carotid artery stenosis by Doppler criteria. LEFT VERTEBRAL ARTERY:  Normal antegrade flow. IMPRESSION: 1. Minimal left and no appreciable right carotid plaque, with no hemodynamically significant internal carotid stenosis (0- 49%). 2. Normal antegrade flow in both vertebral arteries. Electronically Signed   By: Ilona Sorrel M.D.   On: 11/07/2015 16:45    EKG:   Orders placed or performed during the hospital encounter of 11/06/15  . ED EKG  . ED EKG    ASSESSMENT AND PLAN:   This is an 81 year old Caucasian female admitted for encephalopathy and possible stroke.  #. Encephalopathy: Could be from CVA/acute cystitis/pneumonia Patient is altered, has baseline Alzheimer's dementia  #GI bleed Will keep her nothing by mouth Check hemoglobin and hematocrit every 6 hours, we will transfuse blood as needed Provide Protonix Bleeding scan is ordered which is pending Appreciate GI recommendations Stool for C. difficile toxin is negative Discontinue aspirin and other blood thinners  #. CVA: Possible;  CT head is nonconclusive  MRA of the brain with left  MCA CVA,  carotid Dopplers  and 2-D echocardiogram nmL Appreciate Neurology recommendations As per RN report pt passed  bedside swallow evaluation  fasting lipid panel - LDL at 144,  started patient on statin discontinue aspirin 2/2 #1 PT recommended home PT  #Abnormal urinalysis  urine culture ntd  on IV levofloxacin for possible UTI  #Pneumonia Will put the patient on aspiration precautions Treat her for healthcare associated pneumonia with Zosyn and levofloxacin as patient is from a healthcare facility sputum culture and sensitivity ordered Rpt cxr with left lower lobe infiltrate. Continue current antibiotics IS  #Orthostatic hypotension Gentle hydration with IV fluids provided  #. Dementia: Continue Aricept and Namenda; Seroquel as needed and trazodone daily at bedtime per home regimen  #. Depression: Continue Zoloft      #D VT prophylaxis: SCDs  #Disposition home with home PT when clinically improved  The patient is a DO NOT RESUSCITATE.       All the records are reviewed and case discussed with Care Management/Social Workerr. Management plans discussed with the patient, daughter at bedside  and she is in in agreement.  CODE STATUS: DO NOT RESUSCITATE  TOTAL TIME TAKING CARE OF THIS PATIENT: 35 minutes.   POSSIBLE D/C IN 1-2 DAYS, DEPENDING ON CLINICAL CONDITION.   Nicholes Mango M.D on 11/09/2015 at 2:30 PM  Between 7am to 6pm - Pager - [phone] After 6pm go to [email or web address] - password [email or web address] [facility]  [facility]  Office  [phone]  CC: Primary care physician; Mathews Argyle, MD

## 2015-11-09 NOTE — Plan of Care (Signed)
Problem: Nutrition: Goal: Risk of aspiration will decrease Outcome: Progressing Sitter at the bedside. Patient with multiple bouts of bloody stool today. MD aware. GI consult done. Blood loss scan in NM ordered, patient in scan now.  HGB check q6h. Last HGB 10.4. MD aware of drop. Family at the bedside.

## 2015-11-09 NOTE — Consult Note (Signed)
GI Inpatient Consult Note  Reason for Consult: BRBPR   Attending Requesting Consult: Dr. Lavetta Nielsen  History of Present Illness: [name] is a 81 y.o. female with a significant PMH of advanced dementia is being hospitalized for acute left MCA.  She is also being treated for possible pneumonia and UTI with Zosyn and levaquin.  She started experiencing blood in her stool this morning.  She has a Actuary and she reported that she had two episodes prior to my visit, last one between 9:30-10 am.  Loose, not watery stool, mixed with dark blood. When I went to check her rectum, she had already had another stool.  Wine colored stool/blood, no foul smell.  Her daughter is present and she reports that her mother has not complained of any abdominal pain.  She has had a previous colonoscopy, but it was many years ago and we do not have a record.  Daughter is unsure if mother has a history of diverticula. Does report the patients mother died from colon cancer at the age of 108.  She is not on anticoagulants, does take 81 mg asa.  Sitter reports patient ate a good breakfast.  Past Medical History:  Past Medical History  Diagnosis Date  . Cancer ([place])   . Heart murmur   . Breast cancer [facility])     h/o    Problem List: Patient Active Problem List   Diagnosis Date Noted  . CVA (cerebral infarction) 11/08/2015  . Encephalopathy 11/07/2015    Past Surgical History: Past Surgical History  Procedure Laterality Date  . Mastectomy  Rt side    1998  . Bladder tack    . Rotator cuff repair Right     Allergies: Allergies  Allergen Reactions  . Sulfa Antibiotics Hives    Home Medications: Prescriptions prior to admission  Medication Sig Dispense Refill Last Dose  . cetirizine (ZYRTEC) 10 MG tablet Take 10 mg by mouth daily as needed for allergies.   PRN at PRN  . donepezil (ARICEPT) 10 MG tablet Take 10 mg by mouth at bedtime.   11/05/2015 at Unknown time  . fluticasone (FLONASE) 50 MCG/ACT nasal  spray Place 1 spray into both nostrils daily.   11/06/2015 at Unknown time  . memantine (NAMENDA XR) 28 MG CP24 24 hr capsule Take 28 mg by mouth daily.   11/06/2015 at Unknown time  . Menthol-Zinc Oxide (RISAMINE) 0.44-20.625 % OINT Apply 1 application topically as needed (to affected areas).   PRN at PRN  . metoprolol tartrate (LOPRESSOR) 25 MG tablet Take 25 mg by mouth 2 (two) times daily.   11/06/2015 at 0800  . QUEtiapine (SEROQUEL) 200 MG tablet Take 200 mg by mouth 2 (two) times daily as needed (for agitation).   PRN at PRN  . sertraline (ZOLOFT) 100 MG tablet Take 100 mg by mouth daily.   11/06/2015 at Unknown time  . traZODone (DESYREL) 50 MG tablet Take 50 mg by mouth at bedtime.   11/05/2015 at Unknown time   Home medication reconciliation was completed with the patient.   Scheduled Inpatient Medications:   . aspirin EC  81 mg Oral Daily  . docusate sodium  100 mg Oral BID  . donepezil  10 mg Oral QHS  . feeding supplement (ENSURE ENLIVE)  237 mL Oral TID WC  . fluticasone  1 spray Each Nare Daily  . levofloxacin (LEVAQUIN) IV  750 mg Intravenous Q48H  . loratadine  10 mg Oral Daily  . memantine  28 mg Oral Daily  . metoprolol tartrate  25 mg Oral BID  . piperacillin-tazobactam (ZOSYN)  IV  3.375 g Intravenous 3 times per day  . pravastatin  40 mg Oral q1800  . sertraline  100 mg Oral Daily  . traZODone  50 mg Oral QHS    Continuous Inpatient Infusions:   . sodium chloride 100 mL/hr at 11/08/15 2310    PRN Inpatient Medications:  acetaminophen **OR** acetaminophen, ondansetron **OR** ondansetron (ZOFRAN) IV, QUEtiapine, sodium chloride, zinc oxide  Family History: family history includes CAD in her father; Leukemia in her sister.    Social History:   reports that she has never smoked. She does not have any smokeless tobacco history on file. She reports that she does not drink alcohol or use illicit drugs.    Review of Systems: Unable to assess due to mental status     Physical Examination: BP 157/74 mmHg  Pulse 64  Temp(Src) 98.4 F (36.9 C) (Oral)  Resp 18  Ht 5\' 6"  (1.676 m)  Wt 61.1 kg (134 lb 11.2 oz)  BMI 21.75 kg/m2  SpO2 100% Gen: NAD,responds to her name, very pleasant, no complaints of pain HEENT: PEERLA, EOMI, Neck: supple, no JVD or thyromegaly Chest: CTA bilaterally, no wheezes, crackles, or other adventitious sounds CV: RRR, no m/g/c/r Abd: soft, NT, ND, +BS in all four quadrants; no HSM, guarding, ridigity, or rebound tenderness Ext: no edema, well perfused with 2+ pulses, Skin: no rash or lesions noted Lymph: no LAD  Data: Lab Results  Component Value Date   WBC 7.8 11/09/2015   HGB 11.3* 11/09/2015   HCT 33.1* 11/09/2015   MCV 86.8 11/09/2015   PLT 179 11/09/2015    Recent Labs Lab 11/06/15 2124 11/09/15 0515  HGB 10.9* 11.3*   Lab Results  Component Value Date   NA 141 11/06/2015   K 3.7 11/06/2015   CL 107 11/06/2015   CO2 27 11/06/2015   BUN 14 11/06/2015   CREATININE 0.93 11/06/2015   Lab Results  Component Value Date   ALT 9* 11/06/2015   AST 12* 11/06/2015   ALKPHOS 75 11/06/2015   BILITOT 0.2* 11/06/2015   No results for input(s): APTT, INR, PTT in the last 168 hours.  Assessment/Plan: [name] is a 81 y.o. female with recent onset of dark blood per rectum.  She has had three episodes so far today. WBC wnl, Hgb on admission 10.9, this morning 11.3.  Hgb on 03/05/2015 was 13.8. No abdominal imaging completed.  Being treated with Levaquin and Zosyn for possible pneumonia and UTI.  Recommendations: We recommend a bleed scan for further evaluation.  C diff and stool culture studies as well.  We will continue to follow with you.  Thank you for the consult. Please call with questions or concerns.  Salvadore Farber, PA-C  I personally performed these services.

## 2015-11-09 NOTE — Plan of Care (Signed)
Problem: Education: Goal: Knowledge of disease or condition will improve Outcome: Not Progressing Pt confused  Problem: Nutrition: Goal: Risk of aspiration will decrease Outcome: Progressing Tolerating diet

## 2015-11-09 NOTE — Progress Notes (Signed)
TC to Dr. Lavetta Nielsen with + blood study report read to MD. MD will review orders and will follow up as needed. Pt just returned to floor with no bleeding noted at this time. VSS. Color pale.

## 2015-11-09 NOTE — Consult Note (Signed)
Pt with rectal bleeding and acute CVA.  I recommend she have a Nuclear med bleeding scan.  She is not a candidate for colonoscopy due to inability to drink prep and her acute stroke.  Last hgb around 11, previous was 10.4, VSS.  Discussed with her family.  No new suggestions at this time.

## 2015-11-10 ENCOUNTER — Inpatient Hospital Stay: Payer: Medicare Other

## 2015-11-10 LAB — CBC
HCT: 31.5 % — ABNORMAL LOW (ref 35.0–47.0)
HEMATOCRIT: 32.7 % — AB (ref 35.0–47.0)
HEMATOCRIT: 33.5 % — AB (ref 35.0–47.0)
HEMATOCRIT: 30.8 % — AB (ref 35.0–47.0)
HEMOGLOBIN: 10.7 g/dL — AB (ref 12.0–16.0)
HEMOGLOBIN: 10 g/dL — AB (ref 12.0–16.0)
Hemoglobin: 10.5 g/dL — ABNORMAL LOW (ref 12.0–16.0)
Hemoglobin: 11 g/dL — ABNORMAL LOW (ref 12.0–16.0)
MCH: 28.4 pg (ref 26.0–34.0)
MCH: 28.6 pg (ref 26.0–34.0)
MCH: 28.7 pg (ref 26.0–34.0)
MCH: 29 pg (ref 26.0–34.0)
MCHC: 32.6 g/dL (ref 32.0–36.0)
MCHC: 32.7 g/dL (ref 32.0–36.0)
MCHC: 32.8 g/dL (ref 32.0–36.0)
MCHC: 33.4 g/dL (ref 32.0–36.0)
MCV: 87.3 fL (ref 80.0–100.0)
MCV: 87.1 fL (ref 80.0–100.0)
MCV: 86.8 fL (ref 80.0–100.0)
MCV: 87.5 fL (ref 80.0–100.0)
PLATELETS: 163 10*3/uL (ref 150–440)
PLATELETS: 164 10*3/uL (ref 150–440)
Platelets: 166 10*3/uL (ref 150–440)
Platelets: 166 10*3/uL (ref 150–440)
RBC: 3.53 MIL/uL — ABNORMAL LOW (ref 3.80–5.20)
RBC: 3.74 MIL/uL — AB (ref 3.80–5.20)
RBC: 3.63 MIL/uL — AB (ref 3.80–5.20)
RBC: 3.83 MIL/uL (ref 3.80–5.20)
RDW: 13.2 % (ref 11.5–14.5)
RDW: 13.2 % (ref 11.5–14.5)
RDW: 13.3 % (ref 11.5–14.5)
RDW: 13.5 % (ref 11.5–14.5)
WBC: 7.1 10*3/uL (ref 3.6–11.0)
WBC: 8.7 10*3/uL (ref 3.6–11.0)
WBC: 10.1 10*3/uL (ref 3.6–11.0)
WBC: 8.3 10*3/uL (ref 3.6–11.0)

## 2015-11-10 LAB — BASIC METABOLIC PANEL
Anion gap: 6 (ref 5–15)
BUN: 8 mg/dL (ref 6–20)
CHLORIDE: 109 mmol/L (ref 101–111)
CO2: 25 mmol/L (ref 22–32)
CREATININE: 0.76 mg/dL (ref 0.44–1.00)
Calcium: 8.7 mg/dL — ABNORMAL LOW (ref 8.9–10.3)
GFR calc non Af Amer: >60 mL/min (ref >60)
Glucose, Bld: 89 mg/dL (ref 65–99)
POTASSIUM: 4 mmol/L (ref 3.5–5.1)
Sodium: 140 mmol/L (ref 135–145)

## 2015-11-10 MED ORDER — FLUCONAZOLE 100 MG PO TABS
100.0000 mg | ORAL_TABLET | Freq: Every day | ORAL | Status: DC
  Administered 2015-11-10 – 2015-11-13 (×4): 100 mg via ORAL
  Filled 2015-11-10 (×4): qty 1

## 2015-11-10 NOTE — Progress Notes (Signed)
[facility] NAME: [name]    MR#:  [id number]  DATE OF BIRTH:  October 28, 1928  SUBJECTIVE:  CHIEF COMPLAINT:  Patient has ch dementia and hallucinates per daughter.  Patient's bleeding scan is positive but hb is stable. Denies any abdominal pain and no other episodes of gi bleed over night per nurses report  REVIEW OF SYSTEMS:  Unobtainable because of the altered mental status  DRUG ALLERGIES:   Allergies  Allergen Reactions  . Sulfa Antibiotics Hives    VITALS:  Blood pressure 115/77, pulse 90, temperature 97.7 F (36.5 C), temperature source Oral, resp. rate 18, height 5\' 6"  (1.676 m), weight 61.19 kg (134 lb 14.4 oz), SpO2 99 %.  PHYSICAL EXAMINATION:  GENERAL:  82 y.o.-year-old patient lying in the bed with no acute distress.  EYES: Pupils equal, round, reactive to light and accommodation. No scleral icterus.  HEENT: Head atraumatic, normocephalic. Oropharynx and nasopharynx clear.  NECK:  Supple, no jugular venous distention. No thyroid enlargement, no tenderness.  LUNGS: Normal breath sounds bilaterally, no wheezing, rales,rhonchi or crepitation. No use of accessory muscles of respiration.  CARDIOVASCULAR: S1, S2 normal. No murmurs, rubs, or gallops.  ABDOMEN: Soft, nontender, nondistended. Bowel sounds present. No organomegaly or mass.  EXTREMITIES: No pedal edema, cyanosis, or clubbing.  NEUROLOGIC: Cranial nerves are grossly intact. Muscle strength 4/5 on rt side, at her baseline on lt side, followa few verbal commands . Sensation intact. Gait not checked. Dysarthric speech is better PSYCHIATRIC: The patient is alert and disoriented SKIN: No obvious rash, lesion, or ulcer.    LABORATORY PANEL:   CBC  Recent Labs Lab 11/10/15 1608  WBC 10.1  HGB 10.0*  HCT 30.8*  PLT 166    ------------------------------------------------------------------------------------------------------------------  Chemistries   Recent Labs Lab 11/06/15 2124  11/10/15 0510  NA 141  --  140  K 3.7  --  4.0  CL 107  --  109  CO2 27  --  25  GLUCOSE 99  --  89  BUN 14  --  8  CREATININE 0.93  < > 0.76  CALCIUM 9.0  --  8.7*  AST 12*  --   --   ALT 9*  --   --   ALKPHOS 75  --   --   BILITOT 0.2*  --   --   < > = values in this interval not displayed. ------------------------------------------------------------------------------------------------------------------  Cardiac Enzymes  Recent Labs Lab 11/06/15 2124  TROPONINI 0.03   ------------------------------------------------------------------------------------------------------------------  RADIOLOGY:  Dg Chest 2 View  11/10/2015  CLINICAL DATA:  Shortness of breath EXAM: CHEST  2 VIEW COMPARISON:  11/09/2015 FINDINGS: Borderline cardiomegaly with stable aortic tortuosity. Negative hila. Peripheral asymmetric density on the left is likely overlapping artifact related to breast asymmetry. There is chronic coarsening of lung markings without change to suggest pneumonia or edema. No effusion or pneumothorax. No acute osseous finding. IMPRESSION: Stable.  No indication of acute cardiopulmonary disease. Electronically Signed   By: Monte Fantasia M.D.   On: 11/10/2015 16:01   Dg Chest 2 View  11/09/2015  CLINICAL DATA:  Shortness of breath, history of breast malignancy, previous CVA, nonsmoker. EXAM: CHEST  2 VIEW COMPARISON:  Portable chest x-ray of November 06, 2015 FINDINGS: The lungs are adequately inflated. There is no focal infiltrate. Increased lung markings in the retrocardiac region on the lateral image are noted. The interstitial markings are coarse bilaterally. There is apical pleural thickening bilaterally.  The heart is mildly enlarged. The pulmonary vascularity is not engorged. There is mild tortuosity of the  ascending and descending thoracic aorta. The observed bony thorax is unremarkable. IMPRESSION: Subsegmental atelectasis or early infiltrate likely in the left lower lobe. This is superimposed upon chronically increased interstitial markings and mild cardiomegaly. There is no overt evidence of pulmonary edema. Electronically Signed   By: David  Martinique M.D.   On: 11/09/2015 07:44   Nm Gi Blood Loss  11/09/2015  CLINICAL DATA:  Gastrointestinal bleeding EXAM: NUCLEAR MEDICINE GASTROINTESTINAL BLEEDING SCAN TECHNIQUE: Sequential abdominal images were obtained following intravenous administration of Tc-54m labeled red blood cells. RADIOPHARMACEUTICALS:  21.31 mCi Tc-26m in-vitro labeled red cells. COMPARISON:  None. FINDINGS: On the initial 1 hour imaging, there is a curvilinear area of increased activity identified which increases within the second hour consistent with focal hemorrhage within the sigmoid colon. No other focal areas of hemorrhage are seen. IMPRESSION: Active hemorrhage within the sigmoid colon. Electronically Signed   By: Inez Catalina M.D.   On: 11/09/2015 21:31    EKG:   Orders placed or performed during the hospital encounter of 11/06/15  . ED EKG  . ED EKG    ASSESSMENT AND PLAN:   This is an 81 year old Caucasian female admitted for encephalopathy and possible stroke.  #. Encephalopathy: Could be from CVA/acute cystitis/pneumonia Patient is altered, has baseline Alzheimer's dementia  #GI bleed Bleeding scan is postive for active hemorrhage in sigmoid colon but hb is stable , active bleeding might have stopped spontaneously.  -vascular sx is not considering embolization and defering colonoscopy to GI ,will d/w GI in am Check hemoglobin and hematocrit and  will transfuse blood as needed Provide Protonix Appreciate GI and vascular sx recommendations Stool for C. difficile toxin is negative, stool cx with yeast- diflucan and f/u with ID Held aspirin and other blood thinners for  now st  #. 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,  carotid Dopplers and 2-D echocardiogram nmL Appreciate Neurology recommendations As per RN report pt passed  bedside swallow evaluation  fasting lipid panel - LDL at 144,  started patient on statin discontinued aspirin 2/2 #1 PT recommended home PT  #Abnormal urinalysis  urine culture ntd   #Pneumonia Will put the patient on aspiration precautions Treat her for healthcare associated pneumonia with Zosyn and levofloxacin as patient is from a healthcare facility, as clinically better d/ced zosyn and continue levoflox sputum culture and sensitivity ordered- Sample not collected so far Rpt cxr with left lower lobe infiltrate. Continue current antibiotics IS  #Orthostatic hypotension Gentle hydration with IV fluids provided  #. Dementia: Continue Aricept and Namenda; Seroquel as needed and trazodone daily at bedtime per home regimen  #. Depression: Continue Zoloft      #D VT prophylaxis: SCDs  #Disposition home with home PT when clinically improved  The patient is a DO NOT RESUSCITATE.       All the records are reviewed and case discussed with Care Management/Social Workerr. Management plans discussed with the patient, daughter at bedside  and she is in in agreement.  CODE STATUS: DO NOT RESUSCITATE  TOTAL TIME TAKING CARE OF THIS PATIENT: 35 minutes.   POSSIBLE D/C IN 1-2 DAYS, DEPENDING ON CLINICAL CONDITION.   Nicholes Mango M.D on 11/10/2015 at 8:31 PM  Between 7am to 6pm - Pager - [phone] After 6pm go to [email or web address] - password [facility]  Office  [phone]  CC: Primary care physician; Mathews Argyle, MD

## 2015-11-10 NOTE — Consult Note (Signed)
[facility] Vascular Consult Note  MRN : [id number]  [name] is a 81 y.o. (11-08-1928) female who presents with chief complaint of  Chief Complaint  Patient presents with  . Altered Mental Status  .  History of Present Illness: The patient is being hospitalized for acute left MCA. She has a PMH of advanced dementia. She is also being treated for possible pneumonia and UTI with Zosyn and levaquin. She started experiencing blood in her stool this morning. She has a Actuary and yesterday the sitter reported that the patient had two episodes of blood per rectum when evaluated by Dr. Vira Agar he noted she had a loose, not watery stool, mixed with dark blood. That movement was described as wine colored stool/blood, no foul smell.  Her daughter is present at the time of my evaluation and confirms that her mother has not complained of any abdominal pain. She has had a previous colonoscopy, but it was many years ago and we do not have a record. Daughter is unsure if mother has a history of diverticula.   These findings subsequently led to a bleeding scan which was reported yesterday evening as positive for bleeding in the area of the sigmoid colon  She is not on anticoagulants, does take 81 mg asa.   Current Facility-Administered Medications  Medication Dose Route Frequency Provider Last Rate Last Dose  . 0.9 %  sodium chloride infusion   Intravenous Continuous Harrie Foreman, MD 100 mL/hr at 11/10/15 0043    . acetaminophen (TYLENOL) tablet 650 mg  650 mg Oral Q6H PRN Harrie Foreman, MD       Or  . acetaminophen (TYLENOL) suppository 650 mg  650 mg Rectal Q6H PRN Harrie Foreman, MD      . docusate sodium (COLACE) capsule 100 mg  100 mg Oral BID Harrie Foreman, MD   100 mg at 11/10/15 0916  . donepezil (ARICEPT) tablet 10 mg  10 mg Oral QHS Harrie Foreman, MD   10 mg at 11/08/15 2118  . feeding supplement (ENSURE ENLIVE) (ENSURE ENLIVE) liquid 237  mL  237 mL Oral TID WC Nicholes Mango, MD   237 mL at 11/10/15 1602  . fluconazole (DIFLUCAN) tablet 100 mg  100 mg Oral Daily Nicholes Mango, MD   100 mg at 11/10/15 1602  . fluticasone (FLONASE) 50 MCG/ACT nasal spray 1 spray  1 spray Each Nare Daily Harrie Foreman, MD   1 spray at 11/10/15 [phone]  . levofloxacin (LEVAQUIN) IVPB 750 mg  750 mg Intravenous Q48H Melissa D Maccia, RPH   750 mg at 11/09/15 2208  . loratadine (CLARITIN) tablet 10 mg  10 mg Oral Daily Harrie Foreman, MD   10 mg at 11/10/15 0916  . memantine (NAMENDA XR) 24 hr capsule 28 mg  28 mg Oral Daily Harrie Foreman, MD   28 mg at 11/10/15 0916  . metoprolol tartrate (LOPRESSOR) tablet 25 mg  25 mg Oral BID Harrie Foreman, MD   25 mg at 11/10/15 0916  . ondansetron (ZOFRAN) tablet 4 mg  4 mg Oral Q6H PRN Harrie Foreman, MD       Or  . ondansetron [facility]) injection 4 mg  4 mg Intravenous Q6H PRN Harrie Foreman, MD      . pantoprazole (PROTONIX) injection 40 mg  40 mg Intravenous Q24H Nicholes Mango, MD   40 mg at 11/10/15 1417  . pravastatin (PRAVACHOL) tablet 40 mg  40 mg Oral q1800 Nicholes Mango, MD   40 mg at 11/10/15 1602  . QUEtiapine (SEROQUEL) tablet 200 mg  200 mg Oral BID PRN Harrie Foreman, MD   200 mg at 11/07/15 2310  . sertraline (ZOLOFT) tablet 100 mg  100 mg Oral Daily Harrie Foreman, MD   100 mg at 11/10/15 0916  . sodium chloride 0.9 % injection 3 mL  3 mL Intravenous PRN Nicholes Mango, MD   3 mL at 11/09/15 2207  . traZODone (DESYREL) tablet 50 mg  50 mg Oral QHS Harrie Foreman, MD   50 mg at 11/08/15 2118  . zinc oxide 20 % ointment   Topical PRN Harrie Foreman, MD        Past Medical History  Diagnosis Date  . Cancer ([place])   . Heart murmur   . Breast cancer [facility])     h/o    Past Surgical History  Procedure Laterality Date  . Mastectomy  Rt side    1998  . Bladder tack    . Rotator cuff repair Right     Social History Social History  Substance Use Topics  . Smoking status:  Never Smoker   . Smokeless tobacco: None  . Alcohol Use: No    Family History Family History  Problem Relation Age of Onset  . Leukemia Sister   . CAD Father    no family history of porphyria, autoimmune disease or bleeding clotting disorders  Allergies  Allergen Reactions  . Sulfa Antibiotics Hives     REVIEW OF SYSTEMS (Negative unless checked) obtained largely from the patient's daughter who was present  Constitutional: [] Weight loss  [] Fever  [] Chills Cardiac: [] Chest pain   [] Chest pressure   [] Palpitations   [] Shortness of breath when laying flat   [] Shortness of breath at rest   [] Shortness of breath with exertion. Vascular:  [] Pain in legs with walking   [] Pain in legs at rest   [] Pain in legs when laying flat   [] Claudication   [] Pain in feet when walking  [] Pain in feet at rest  [] Pain in feet when laying flat   [] History of DVT   [] Phlebitis   [] Swelling in legs   [] Varicose veins   [] Non-healing ulcers Pulmonary:   [] Uses home oxygen   [] Productive cough   [] Hemoptysis   [] Wheeze  [] COPD   [] Asthma Neurologic:  [] Dizziness  [] Blackouts   [] Seizures   [] History of stroke   [] History of TIA  [] Aphasia   [] Temporary blindness   [] Dysphagia   [] Weakness or numbness in arms   [] Weakness or numbness in legs Musculoskeletal:  [] Arthritis   [] Joint swelling   [] Joint pain   [] Low back pain Hematologic:  [] Easy bruising  [] Easy bleeding   [] Hypercoagulable state   [] Anemic  [] Hepatitis Gastrointestinal:  [] Blood in stool   [] Vomiting blood  [] Gastroesophageal reflux/heartburn   [] Difficulty swallowing. Genitourinary:  [] Chronic kidney disease   [] Difficult urination  [] Frequent urination  [] Burning with urination   [] Blood in urine Skin:  [] Rashes   [] Ulcers   [] Wounds Psychological:  [] History of anxiety   []  History of major depression.  Physical Examination  Filed Vitals:   11/10/15 0143 11/10/15 0530 11/10/15 0623 11/10/15 1853  BP: 133/63  159/58 115/77  Pulse: 80  70 90   Temp: 98.4 F (36.9 C)  97.7 F (36.5 C)   TempSrc: Oral  Oral   Resp: 20  20 18   Height:  Weight:  61.19 kg (134 lb 14.4 oz)    SpO2: 98%   99%   Body mass index is 21.78 kg/(m^2). Gen:  WD/WN, NAD Head: [place]/AT, No temporalis wasting. Prominent temp pulse not noted. Ear/Nose/Throat: Hearing grossly intact, nares w/o erythema or drainage, oropharynx w/o Erythema/Exudate Eyes: PERRLA, EOMI.  Neck: Supple, no nuchal rigidity.  No bruit or JVD.  Pulmonary:  Good air movement, clear to auscultation bilaterally.  Cardiac: RRR, normal S1, S2, no Murmurs, rubs or gallops. Vascular: Feet are pink and warm with brisk capillary refill Vessel Right Left  Radial Palpable Palpable  Ulnar Palpable Palpable  Brachial Palpable Palpable  Carotid Palpable, without bruit Palpable, without bruit  Aorta Not palpable N/A  Femoral Palpable Palpable  Popliteal Palpable Palpable  PT Palpable Palpable  DP Palpable Palpable   Gastrointestinal: soft, non-tender/non-distended. No guarding/reflex. No masses, surgical incisions, or scars. Musculoskeletal: M/S 5/5 throughout.  Extremities without ischemic changes.  No deformity or atrophy. No edema. Neurologic: CN 2-12 intact. Pain and light touch intact in extremities.  Symmetrical.  Speech is fluent. Motor exam as listed above. Psychiatric: Judgment intact, Mood & affect appropriate for pt's clinical situation. Dermatologic: No rashes or ulcers noted.  No cellulitis or open wounds. Lymph : No Cervical, Axillary, or Inguinal lymphadenopathy.    CBC Lab Results  Component Value Date   WBC 10.1 11/10/2015   HGB 10.0* 11/10/2015   HCT 30.8* 11/10/2015   MCV 87.1 11/10/2015   PLT 166 11/10/2015    BMET    Component Value Date/Time   NA 140 11/10/2015 0510   NA 142 03/19/2015 2008   K 4.0 11/10/2015 0510   K 3.6 03/19/2015 2008   CL 109 11/10/2015 0510   CL 106 03/19/2015 2008   CO2 25 11/10/2015 0510   CO2 28 03/19/2015 2008   GLUCOSE 89  11/10/2015 0510   GLUCOSE 129* 03/19/2015 2008   BUN 8 11/10/2015 0510   BUN 24* 03/19/2015 2008   CREATININE 0.76 11/10/2015 0510   CREATININE 1.12* 03/19/2015 2008   CALCIUM 8.7* 11/10/2015 0510   CALCIUM 9.4 03/19/2015 2008   GFRNONAA >60 11/10/2015 0510   GFRNONAA 45* 03/19/2015 2008   GFRAA >60 11/10/2015 0510   GFRAA 52* 03/19/2015 2008   Estimated Creatinine Clearance: 49 mL/min (by C-G formula based on Cr of 0.76).  COAG Lab Results  Component Value Date   INR 1.1 03/05/2015    Radiology Dg Chest 2 View  11/10/2015  CLINICAL DATA:  Shortness of breath EXAM: CHEST  2 VIEW COMPARISON:  11/09/2015 FINDINGS: Borderline cardiomegaly with stable aortic tortuosity. Negative hila. Peripheral asymmetric density on the left is likely overlapping artifact related to breast asymmetry. There is chronic coarsening of lung markings without change to suggest pneumonia or edema. No effusion or pneumothorax. No acute osseous finding. IMPRESSION: Stable.  No indication of acute cardiopulmonary disease. Electronically Signed   By: Monte Fantasia M.D.   On: 11/10/2015 16:01   Dg Chest 2 View  11/09/2015  CLINICAL DATA:  Shortness of breath, history of breast malignancy, previous CVA, nonsmoker. EXAM: CHEST  2 VIEW COMPARISON:  Portable chest x-ray of November 06, 2015 FINDINGS: The lungs are adequately inflated. There is no focal infiltrate. Increased lung markings in the retrocardiac region on the lateral image are noted. The interstitial markings are coarse bilaterally. There is apical pleural thickening bilaterally. The heart is mildly enlarged. The pulmonary vascularity is not engorged. There is mild tortuosity of the ascending and descending  thoracic aorta. The observed bony thorax is unremarkable. IMPRESSION: Subsegmental atelectasis or early infiltrate likely in the left lower lobe. This is superimposed upon chronically increased interstitial markings and mild cardiomegaly. There is no overt  evidence of pulmonary edema. Electronically Signed   By: David  Martinique M.D.   On: 11/09/2015 07:44   Ct Head Wo Contrast  11/06/2015  CLINICAL DATA:  Confusion. Altered mental status. History of right breast cancer and right mastectomy. EXAM: CT HEAD WITHOUT CONTRAST TECHNIQUE: Contiguous axial images were obtained from the base of the skull through the vertex without intravenous contrast. COMPARISON:  None. FINDINGS: There is questionable loss of gray-white differentiation in the left frontotemporal region. There is a small low-density focus in the genu of the left internal capsule. No evidence of parenchymal hemorrhage or extra-axial fluid collection. No mass lesion, mass effect, or midline shift. Intracranial atherosclerosis. Nonspecific subcortical and periventricular white matter hypodensity, most in keeping with chronic small vessel ischemic change. There is diffuse cerebral volume loss. No ventriculomegaly. The visualized paranasal sinuses are essentially clear. The mastoid air cells are unopacified. No evidence of calvarial fracture. IMPRESSION: 1. Questionable loss of gray-white differentiation in the left frontotemporal region, cannot exclude an acute stroke in this location. If clinically warranted, consider further evaluation with brain MRI. 2. Small low-density focus in the genu of the left internal capsule, likely a remote lacunar infarct. 3. Intracranial atherosclerosis, diffuse cerebral volume loss and chronic small vessel ischemic white matter change. These results were called by telephone at the time of interpretation on 11/06/2015 at 9:50 pm to Dr. Delman Kitten , who verbally acknowledged these results. Electronically Signed   By: Ilona Sorrel M.D.   On: 11/06/2015 21:54   Mr Brain Wo Contrast  11/07/2015  CLINICAL DATA:  Patient presents from assisted living facility with right-sided weakness, slurred speech, and RIGHT facial droop. Symptom duration unspecified. EXAM: MRI HEAD WITHOUT CONTRAST  TECHNIQUE: Multiplanar, multiecho pulse sequences of the brain and surrounding structures were obtained without intravenous contrast. COMPARISON:  CT head earlier in the day. FINDINGS: The patient was unable to remain motionless for the exam. Small or subtle lesions could be overlooked. There is a moderate-sized area restricted diffusion, affecting much of the LEFT frontal opercular cortex and a small region of the superior LEFT insula consistent with acute infarction. This correlates with the visualized CT abnormality of cytotoxic edema. This likely represents a LEFT M4 branch occlusion. No hemorrhage, mass lesion, hydrocephalus, or extra-axial fluid. Advanced cerebral and cerebellar atrophy. Extensive T2 and FLAIR hyperintensities throughout the periventricular and subcortical white matter representing chronic microvascular ischemic change. Flow voids are maintained in the carotid, basilar, and vertebral arteries. There is no obvious large vessel occlusion affecting the proximal LEFT MCA, but there is too much motion degradation to assess for the presence of a and MCA branch thrombosis. No obvious midline abnormality. Extracranial soft tissues grossly unremarkable. The other area questioned on CT, posterior caudate/genu internal capsule, represents a chronic lacunar infarct. IMPRESSION: Moderate-sized acute LEFT MCA infarction affecting much of the LEFT frontal operculum. No evidence for hemorrhage. Atrophy and small vessel disease. Electronically Signed   By: Staci Righter M.D.   On: 11/07/2015 16:11   Nm Gi Blood Loss  11/09/2015  CLINICAL DATA:  Gastrointestinal bleeding EXAM: NUCLEAR MEDICINE GASTROINTESTINAL BLEEDING SCAN TECHNIQUE: Sequential abdominal images were obtained following intravenous administration of Tc-8m labeled red blood cells. RADIOPHARMACEUTICALS:  21.31 mCi Tc-25m in-vitro labeled red cells. COMPARISON:  None. FINDINGS: On the initial 1  hour imaging, there is a curvilinear area of  increased activity identified which increases within the second hour consistent with focal hemorrhage within the sigmoid colon. No other focal areas of hemorrhage are seen. IMPRESSION: Active hemorrhage within the sigmoid colon. Electronically Signed   By: Inez Catalina M.D.   On: 11/09/2015 21:31   US Carotid Bilateral  11/07/2015  CLINICAL DATA:  Acute left MCA infarct.  Dizziness. EXAM: BILATERAL CAROTID DUPLEX ULTRASOUND TECHNIQUE: Pearline Cables scale imaging, color Doppler and duplex ultrasound were performed of bilateral carotid and vertebral arteries in the neck. COMPARISON:  None. FINDINGS: Criteria: Quantification of carotid stenosis is based on velocity parameters that correlate the residual internal carotid diameter with NASCET-based stenosis levels, using the diameter of the distal internal carotid lumen as the denominator for stenosis measurement. The following velocity measurements were obtained: RIGHT ICA:  40 cm/sec CCA:  66 cm/sec SYSTOLIC ICA/CCA RATIO:  0.6 DIASTOLIC ICA/CCA RATIO:  1.0 ECA:  50 cm/sec LEFT ICA:  43 cm/sec CCA:  62 cm/sec SYSTOLIC ICA/CCA RATIO:  0.7 DIASTOLIC ICA/CCA RATIO:  0.8 ECA:  55 cm/sec RIGHT CAROTID ARTERY: No appreciable plaque at the right carotid bulb or bifurcation on grayscale imaging. No hemodynamically significant right internal carotid artery stenosis by Doppler criteria. RIGHT VERTEBRAL ARTERY:  Normal antegrade flow. LEFT CAROTID ARTERY: Minimal partially calcified plaque at the left carotid bulb and bifurcation, with less than 20% proximal left internal carotid artery stenosis by grayscale imaging. No hemodynamically significant left internal carotid artery stenosis by Doppler criteria. LEFT VERTEBRAL ARTERY:  Normal antegrade flow. IMPRESSION: 1. Minimal left and no appreciable right carotid plaque, with no hemodynamically significant internal carotid stenosis (0- 49%). 2. Normal antegrade flow in both vertebral arteries. Electronically Signed   By: Ilona Sorrel  M.D.   On: 11/07/2015 16:45   Dg Chest Port 1 View  11/06/2015  CLINICAL DATA:  Altered mental status. EXAM: PORTABLE CHEST 1 VIEW COMPARISON:  None. FINDINGS: 2115 hours. There are low lung volumes with patchy left-greater-than-right basilar airspace opacities. The heart size and mediastinal contours are unremarkable for the degree of inspiration. There is mild aortic atherosclerosis. No pneumothorax or significant pleural effusion identified. There are mild degenerative changes at both shoulders. IMPRESSION: Low lung volumes with patchy bibasilar opacities, possibly atelectasis. Early aspiration cannot be excluded, especially at the left base. Electronically Signed   By: Richardean Sale M.D.   On: 11/06/2015 21:24    Assessment/Plan 1. GI bleed: At the present time the patient's hemoglobins are increasing and her most recent hemoglobin is actually near baseline at 11.0. She has not had any further bloody bowel movements in the past 12 hours. Although the bleeding scan is positive she does appear to have stopped and therefore I do not feel that angiography with embolization is indicated at this time. However, even the results of the scan should she once again start back with significant bleeding then clearly embolization in the IMA distribution would be helpful. Further workup including colonoscopy is deferred to the gastroenterology service as they see fit 2. CVA: Possible; the patient has not suffered any falls. See above 3. Dementia: Continue Aricept and Namenda; Seroquel as needed and trazodone daily at bedtime per home regimen 4. Depression: Continue Zoloft  5. Lung opacity: The patient has no clinical signs or symptoms of pneumonia.   Arthuro Canelo, Dolores Lory, MD  11/10/2015 7:09 PM

## 2015-11-10 NOTE — Care Management Important Message (Signed)
Important Message  Patient Details  Name: [name] MRN: [id number] Date of Birth: 01-07-1928   Medicare Important Message Given:  Yes    Juliann Pulse A O[place]ane Fosse 11/10/2015, 11:12 AM

## 2015-11-10 NOTE — Progress Notes (Signed)
Physical Therapy Treatment Patient Details Name: [name] MRN: [id number] DOB: September 28, 1928 Today's Date: 11/10/2015    History of Present Illness The patient presents emergency department from her assisted living facility when staff noticed that she was weak on her right side, had a possible right facial droop as well as slurred speech. The patient denies complaints. Initial valuation emergency department revealed no focal neurologic deficits although the patient did not (or could not) cooperate with full neurologic exam. CT of the head revealed some questionable areas of attenuation left frontal parietal lobe as well as a remote lacunar infarct on the left. Also, chest x-ray showed some area of opacity in the base of the left lung which prompted emergency . [name] and her husband were living at [facility].    [name] Comments    [name] was able to walk with lesser assistance, safer mobiltiy and good response to [name] visit with there ex to LE's.  Her daughter is in and talked about progression of her care, and looking positive to get home with more safety of mobility.  HHPT is going to follow up with her.  Follow Up Recommendations  Home health [name];Other (comment)     Equipment Recommendations  None recommended by [name]    Recommendations for Other Services       Precautions / Restrictions Precautions Precautions: Fall Precaution Comments: 1:1 sitter in room at all times Restrictions Weight Bearing Restrictions: No    Mobility  Bed Mobility Overal bed mobility: Modified Independent                Transfers Overall transfer level: Needs assistance (with assistance only for lines, which [name] does not attend to ) Equipment used: 1 person hand held assist                Ambulation/Gait Ambulation/Gait assistance: Min guard Ambulation Distance (Feet): 350 Feet Assistive device: 1 person hand held assist (with [name] handling IV ole) Gait Pattern/deviations: Step-through  pattern;Drifts right/left;Wide base of support Gait velocity: reduced Gait velocity interpretation: Below normal speed for age/gender General Gait Details: controlling her IV and [name] is managing gait with HHA   Stairs            Wheelchair Mobility    Modified Rankin (Stroke Patients Only)       Balance     Sitting balance-Leahy Scale: Good       Standing balance-Leahy Scale: Fair                      Tourist information centre manager - Lower Extremity Long Arc Quad: Strengthening;Both;10 reps Heel Slides: Strengthening;Both;10 reps Hip ABduction/ADduction: Strengthening;Both;10 reps    General Comments General comments (skin integrity, edema, etc.): [name] has a dramatically more calm appearance and better control of mvoement, able to walk without any real concern about issues pulling at IV line      Pertinent Vitals/Pain      Home Living Family/patient expects to be discharged to:: Skilled nursing facility               Additional Comments: was at [facility] with her husband prior to admission    Prior Function Level of Independence: Needs assistance          [name] Goals (current goals can now be found in the  care plan section)      Frequency  Min 2X/week    [name] Plan      Co-evaluation             End of Session Equipment Utilized During Treatment: Gait belt Activity Tolerance: Patient tolerated treatment well Patient left: in bed;with call bell/phone within reach;with bed alarm set;with family/visitor present;with nursing/sitter in room     Time: [id number] [name] Time Calculation (min) (ACUTE ONLY): 18 min  Charges:  $Gait Training: 8-22 mins                    G Codes:      Ramond Dial 11/25/15, 4:41 PM   Mee Hives, PT MS Acute Rehab Dept. Number: [facility] [phone] and [place] [phone]

## 2015-11-10 NOTE — Consult Note (Signed)
Pt bleeding has stopped based on hgb reading of 10 and stool now dark.  She did have a CVA and has some dementia and these make it very difficult (impossible) to do a colonoscopy.  Vascular surgery has seen her.  No new suggestions.

## 2015-11-10 NOTE — Plan of Care (Addendum)
Problem: Education: Goal: Knowledge of disease or condition will improve Outcome: Progressing Pt with advanced dementia unable to teach patient   Problem: Nutrition: Goal: Risk of aspiration will decrease Outcome: Progressing Aspiration precautions in place. Pt NPO except sips with medications.     NIH score of 4. Pt easily agitated. 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 Home with home health PT at discharge.

## 2015-11-11 LAB — BASIC METABOLIC PANEL
ANION GAP: 6 (ref 5–15)
BUN: 8 mg/dL (ref 6–20)
CALCIUM: 8.2 mg/dL — AB (ref 8.9–10.3)
CO2: 23 mmol/L (ref 22–32)
Chloride: 109 mmol/L (ref 101–111)
Creatinine, Ser: 0.81 mg/dL (ref 0.44–1.00)
GFR calc Af Amer: >60 mL/min (ref >60)
GFR calc non Af Amer: >60 mL/min (ref >60)
GLUCOSE: 105 mg/dL — AB (ref 65–99)
Potassium: 3.1 mmol/L — ABNORMAL LOW (ref 3.5–5.1)
Sodium: 138 mmol/L (ref 135–145)

## 2015-11-11 LAB — CBC
HEMATOCRIT: 28.2 % — AB (ref 35.0–47.0)
Hemoglobin: 9.3 g/dL — ABNORMAL LOW (ref 12.0–16.0)
MCH: 28.4 pg (ref 26.0–34.0)
MCHC: 32.8 g/dL (ref 32.0–36.0)
MCV: 86.7 fL (ref 80.0–100.0)
Platelets: 150 10*3/uL (ref 150–440)
RBC: 3.25 MIL/uL — ABNORMAL LOW (ref 3.80–5.20)
RDW: 13.4 % (ref 11.5–14.5)
WBC: 10.9 10*3/uL (ref 3.6–11.0)

## 2015-11-11 MED ORDER — POTASSIUM CHLORIDE 20 MEQ PO PACK
40.0000 meq | PACK | Freq: Once | ORAL | Status: AC
  Administered 2015-11-11: 11:00:00 40 meq via ORAL
  Filled 2015-11-11: qty 2

## 2015-11-11 MED ORDER — LEVOFLOXACIN 750 MG PO TABS
750.0000 mg | ORAL_TABLET | ORAL | Status: DC
  Administered 2015-11-11: 750 mg via ORAL
  Filled 2015-11-11: qty 1

## 2015-11-11 MED ORDER — HALOPERIDOL LACTATE 5 MG/ML IJ SOLN
1.0000 mg | INTRAMUSCULAR | Status: AC
  Administered 2015-11-11: 1 mg via INTRAVENOUS
  Filled 2015-11-11: qty 1

## 2015-11-11 NOTE — Progress Notes (Signed)
[facility] [place] NAME: [name]    MR#:  [id number]  DATE OF BIRTH:  [birth year]  SUBJECTIVE:  CHIEF COMPLAINT:  Patient has ch dementia and hallucinates per daughter.  Patient's bleeding scan is positive but hb is stable. Denies any abdominal pain . As per RN's report pt had black tarry stool with fresh blood streaks   REVIEW OF SYSTEMS:  Unobtainable because of the altered mental status  DRUG ALLERGIES:   Allergies  Allergen Reactions  . Sulfa Antibiotics Hives    VITALS:  Blood pressure 147/69, pulse 84, temperature 99.5 F (37.5 C), temperature source Oral, resp. rate 20, height 5\' 6"  (1.676 m), weight 62.279 kg (137 lb 4.8 oz), SpO2 100 %.  PHYSICAL EXAMINATION:  GENERAL:  81 y.o.-year-old patient lying in the bed with no acute distress.  EYES: Pupils equal, round, reactive to light and accommodation. No scleral icterus.  HEENT: Head atraumatic, normocephalic. Oropharynx and nasopharynx clear.  NECK:  Supple, no jugular venous distention. No thyroid enlargement, no tenderness.  LUNGS: Normal breath sounds bilaterally, no wheezing, rales,rhonchi or crepitation. No use of accessory muscles of respiration.  CARDIOVASCULAR: S1, S2 normal. No murmurs, rubs, or gallops.  ABDOMEN: Soft, nontender, nondistended. Bowel sounds present. No organomegaly or mass.  EXTREMITIES: No pedal edema, cyanosis, or clubbing.  NEUROLOGIC: Cranial nerves are grossly intact. Muscle strength 4/5 on rt side, at her baseline on lt side, followa few verbal commands . Sensation intact. Gait not checked. Dysarthric speech is better PSYCHIATRIC: The patient is alert and disoriented SKIN: No obvious rash, lesion, or ulcer.    LABORATORY PANEL:   CBC  Recent Labs Lab 11/11/15 0500  WBC 10.9  HGB 9.3*  HCT 28.2*  PLT 150    ------------------------------------------------------------------------------------------------------------------  Chemistries   Recent Labs Lab 11/06/15 2124  11/11/15 0500  NA 141  < > 138  K 3.7  < > 3.1*  CL 107  < > 109  CO2 27  < > 23  GLUCOSE 99  < > 105*  BUN 14  < > 8  CREATININE 0.93  < > 0.81  CALCIUM 9.0  < > 8.2*  AST 12*  --   --   ALT 9*  --   --   ALKPHOS 75  --   --   BILITOT 0.2*  --   --   < > = values in this interval not displayed. ------------------------------------------------------------------------------------------------------------------  Cardiac Enzymes  Recent Labs Lab 11/06/15 2124  TROPONINI 0.03   ------------------------------------------------------------------------------------------------------------------  RADIOLOGY:  Dg Chest 2 View  11/10/2015  CLINICAL DATA:  Shortness of breath EXAM: CHEST  2 VIEW COMPARISON:  11/09/2015 FINDINGS: Borderline cardiomegaly with stable aortic tortuosity. Negative hila. Peripheral asymmetric density on the left is likely overlapping artifact related to breast asymmetry. There is chronic coarsening of lung markings without change to suggest pneumonia or edema. No effusion or pneumothorax. No acute osseous finding. IMPRESSION: Stable.  No indication of acute cardiopulmonary disease. Electronically Signed   By: Monte Fantasia M.D.   On: 11/10/2015 16:01   Nm Gi Blood Loss  11/09/2015  CLINICAL DATA:  Gastrointestinal bleeding EXAM: NUCLEAR MEDICINE GASTROINTESTINAL BLEEDING SCAN TECHNIQUE: Sequential abdominal images were obtained following intravenous administration of Tc-26m labeled red blood cells. RADIOPHARMACEUTICALS:  21.31 mCi Tc-55m in-vitro labeled red cells. COMPARISON:  None. FINDINGS: On the initial 1 hour imaging, there is a curvilinear area of increased activity identified which increases within the second hour  consistent with focal hemorrhage within the sigmoid colon. No other focal areas of  hemorrhage are seen. IMPRESSION: Active hemorrhage within the sigmoid colon. Electronically Signed   By: Inez Catalina M.D.   On: 11/09/2015 21:31    EKG:   Orders placed or performed during the hospital encounter of 11/06/15  . ED EKG  . ED EKG    ASSESSMENT AND PLAN:   This is an 81 year old Caucasian female admitted for encephalopathy and possible stroke.  #. Encephalopathy: Could be from CVA/acute cystitis/pneumonia Patient is altered, has baseline Alzheimer's dementia  #GI bleed Bleeding scan is postive for active hemorrhage in sigmoid colon   hb is 11 -->10 -->9.3  GI is not recommending colonoscopy 2/2 her comorbidities -vascular sx is not considering embolization Check hemoglobin and hematocrit and  will transfuse blood as needed Provide Protonix Appreciate GI and vascular sx recommendations Stool for C. difficile toxin is negative, stool cx with yeast- diflucan and f/u with ID Held aspirin and other blood thinners for now   #. 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,  carotid Dopplers and 2-D echocardiogram nmL Appreciate Neurology recommendations As per RN report pt passed  bedside swallow evaluation  fasting lipid panel - LDL at 144,  started patient on statin discontinued aspirin 2/2 #1, but will resume asa 81 mg   if Hb is stable PT recommended home PT  #Abnormal urinalysis  urine culture ntd   #Pneumonia Will put the patient on aspiration precautions Treat her for healthcare associated pneumonia with Zosyn and levofloxacin as patient is from a healthcare facility, as clinically better d/ced zosyn and continue levoflox sputum culture and sensitivity ordered- Sample not collected so far Rpt cxr with left lower lobe infiltrate. Continue current antibiotics IS  #Orthostatic hypotension Gentle hydration with IV fluids provided  #. Dementia: Continue Aricept and Namenda; Seroquel as needed and trazodone daily at bedtime per home regimen  #.  Depression: Continue Zoloft      #D VT prophylaxis: SCDs  #Disposition home with home PT when clinically improved  The patient is a DO NOT RESUSCITATE.       All the records are reviewed and case discussed with Care Management/Social Workerr. Management plans discussed with the patient, daughter at bedside  and she is in in agreement.  CODE STATUS: DO NOT RESUSCITATE  TOTAL TIME TAKING CARE OF THIS PATIENT: 35 minutes.   POSSIBLE D/C IN 1-2 DAYS, DEPENDING ON CLINICAL CONDITION.   Nicholes Mango M.D on 11/11/2015 at 9:24 PM  Between 7am to 6pm - Pager - [phone] After 6pm go to [email or web address] - password [email or web address] [facility]  [facility]  Office  [phone]  CC: Primary care physician; Mathews Argyle, MD

## 2015-11-11 NOTE — Progress Notes (Signed)
Patient qualified for IV to PO transition per IV to PO policy. Will transition patient to  PO Levofloxacin  therapy.   Larene Beach, PharmD

## 2015-11-11 NOTE — Clinical Social Work Note (Signed)
Clinical Education officer, museum spoke with [employer]. Per [facility], the manager will need to come and do an assessment prior to pt returning. At time of note, PT is recommending HHPT. CSW will call when pt is ready for discharge for assessment. CSW will continue to follow.   Darden Dates, MSW, LCSW Clinical Social Worker [phone]

## 2015-11-11 NOTE — Consult Note (Signed)
Due to very recent stroke and dementia she is absolutely not a candidate for colonoscopy due to the total inability to drink a 4 liter prep which would be necessary to have a procedure to look for the bleeding site.  The only alternative is angiographic treatment if bleeding continues.  Clear liquids recommended if no dysphagia.

## 2015-11-11 NOTE — Plan of Care (Signed)
Problem: Education: Goal: Knowledge of disease or condition will improve Pt oriented to self. History of dementia  Problem: Nutrition: Goal: Risk of aspiration will decrease Outcome: Progressing NIH 4. Up with assist x 1. Increased agitation so Seroquel given with improvement. Took meds crushed in applesauce.

## 2015-11-11 NOTE — Progress Notes (Signed)
Nutrition Follow-up   INTERVENTION:   Meals and Snacks: Cater to patient preferences Medical Food Supplement Therapy: Continue Ensure as pt drinking   NUTRITION DIAGNOSIS:   Inadequate oral intake related to acute illness as evidenced by meal completion < 50%.  GOAL:   Patient will meet greater than or equal to 90% of their needs  MONITOR:    (Energy Intake, Electrolyte and renal Profile, Anthropometrics)  REASON FOR ASSESSMENT:   Malnutrition Screening Tool    ASSESSMENT:   Per MD note, pt with GI bleed in sigmoid colon. Per Sitter 1:1 Stacey pt with loose black, bloody stool this am. Pt confused at times impulsive.    Diet Order:  Diet Heart Room service appropriate?: Yes; Fluid consistency:: Thin    Current Nutrition: Pt ate well this am, eating 75% of meal, everything from tray except eggs. Pt drank 100% of Ensure this am. Recorded po intake 56% of meals in I/O chart.   Gastrointestinal Profile: Last BM: 11/11/2015   Scheduled Medications:  . docusate sodium  100 mg Oral BID  . donepezil  10 mg Oral QHS  . feeding supplement (ENSURE ENLIVE)  237 mL Oral TID WC  . fluconazole  100 mg Oral Daily  . fluticasone  1 spray Each Nare Daily  . levofloxacin  750 mg Oral Q48H  . loratadine  10 mg Oral Daily  . memantine  28 mg Oral Daily  . metoprolol tartrate  25 mg Oral BID  . pantoprazole (PROTONIX) IV  40 mg Intravenous Q24H  . pravastatin  40 mg Oral q1800  . sertraline  100 mg Oral Daily  . traZODone  50 mg Oral QHS    Continuous Medications:  . sodium chloride 100 mL/hr at 11/10/15 2231     Electrolyte/Renal Profile and Glucose Profile:   Recent Labs Lab 11/06/15 2124 11/09/15 0515 11/10/15 0510 11/11/15 0500  NA 141  --  140 138  K 3.7  --  4.0 3.1*  CL 107  --  109 109  CO2 27  --  25 23  BUN 14  --  8 8  CREATININE 0.93 0.85 0.76 0.81  CALCIUM 9.0  --  8.7* 8.2*  GLUCOSE 99  --  89 105*   Protein Profile:   Recent Labs Lab  11/06/15 2124  ALBUMIN 3.7     Nutrition-Focused Physical Exam Findings:  Unable to complete Nutrition-Focused physical exam at this time.    Weight Trend since Admission: Filed Weights   11/09/15 0500 11/10/15 0530 11/11/15 0655  Weight: 134 lb 11.2 oz (61.1 kg) 134 lb 14.4 oz (61.19 kg) 137 lb 4.8 oz (62.279 kg)    Skin:  Reviewed, no issues    BMI:  Body mass index is 22.17 kg/(m^2).  Estimated Nutritional Needs:   Kcal:  BEE: 1070kcals, TEE: (IF 1.1-1.3)(AF 1.2) 1413-1670kcals  Protein:  48-66g protein (0.8-1.0g/kg)  Fluid:  1510-1827mL of fluid (25-103mL/kg)  EDUCATION NEEDS:   Education needs no appropriate at this time   [place], RD, LDN Pager [phone]

## 2015-11-11 NOTE — Plan of Care (Signed)
Problem: Education: Goal: Knowledge of disease or condition will improve Outcome: Progressing Haldol 1mg  IV given for agitation. 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   Problem: Nutrition: Goal: Risk of aspiration will decrease Outcome: Progressing Remains on Aspiration precautions. Health Heart Diet. Decreased Appetite. Ensure Supplement provided. Encouraging Intake.

## 2015-11-12 ENCOUNTER — Inpatient Hospital Stay: Payer: Medicare Other

## 2015-11-12 LAB — BASIC METABOLIC PANEL
Anion gap: 7 (ref 5–15)
BUN: <5 mg/dL — ABNORMAL LOW (ref 6–20)
CALCIUM: 8.4 mg/dL — AB (ref 8.9–10.3)
CHLORIDE: 111 mmol/L (ref 101–111)
CO2: 22 mmol/L (ref 22–32)
CREATININE: 0.63 mg/dL (ref 0.44–1.00)
GFR calc Af Amer: >60 mL/min (ref >60)
GFR calc non Af Amer: >60 mL/min (ref >60)
GLUCOSE: 112 mg/dL — AB (ref 65–99)
Potassium: 3.8 mmol/L (ref 3.5–5.1)
Sodium: 140 mmol/L (ref 135–145)

## 2015-11-12 LAB — STOOL CULTURE

## 2015-11-12 LAB — CBC
HEMATOCRIT: 27.7 % — AB (ref 35.0–47.0)
Hemoglobin: 9.1 g/dL — ABNORMAL LOW (ref 12.0–16.0)
MCH: 28.1 pg (ref 26.0–34.0)
MCHC: 32.8 g/dL (ref 32.0–36.0)
MCV: 85.8 fL (ref 80.0–100.0)
PLATELETS: 153 10*3/uL (ref 150–440)
RBC: 3.23 MIL/uL — AB (ref 3.80–5.20)
RDW: 13.3 % (ref 11.5–14.5)
WBC: 14.8 10*3/uL — ABNORMAL HIGH (ref 3.6–11.0)

## 2015-11-12 LAB — MAGNESIUM: Magnesium: 1.8 mg/dL (ref 1.7–2.4)

## 2015-11-12 MED ORDER — DIPHENHYDRAMINE HCL 25 MG PO CAPS: 25.0000 mg | ORAL_CAPSULE | Freq: Once | ORAL | Status: DC

## 2015-11-12 MED ORDER — LEVOFLOXACIN 750 MG PO TABS
750.0000 mg | ORAL_TABLET | Freq: Every day | ORAL | Status: DC
  Administered 2015-11-13: 11:00:00 750 mg via ORAL
  Filled 2015-11-12 (×2): qty 1

## 2015-11-12 MED ORDER — PIPERACILLIN-TAZOBACTAM 3.375 G IVPB
3.3750 g | Freq: Three times a day (TID) | INTRAVENOUS | Status: DC
  Administered 2015-11-12 – 2015-11-13 (×3): 3.375 g via INTRAVENOUS
  Filled 2015-11-12 (×4): qty 50

## 2015-11-12 MED ORDER — ASPIRIN EC 81 MG PO TBEC
81.0000 mg | DELAYED_RELEASE_TABLET | Freq: Every day | ORAL | Status: DC
  Administered 2015-11-12 – 2015-11-13 (×2): 81 mg via ORAL
  Filled 2015-11-12: qty 1

## 2015-11-12 NOTE — Care Management (Signed)
Patient is from [facility]- Per Anderson Malta at [facility] [phone] they will take patient back if she can walk and feed herself.  They have their own PT in the building but will need therapy order at time of discharge from MD. Anderson Malta is going to call Judson Roch CSW to discuss.

## 2015-11-12 NOTE — Plan of Care (Signed)
Problem: Education: Goal: Knowledge of disease or condition will improve Outcome: Progressing Pt is alert to self. Pt is calm at this time. Reoriented. 1:1 safety sitter present Follows some commands, inattention present Remains incontinent of urine and stool Medication taken in applesauce     Problem: Nutrition: Goal: Risk of aspiration will decrease Outcome: Progressing Remains on aspiration precautions No straws given to patient when drinking.

## 2015-11-12 NOTE — Clinical Social Work Note (Signed)
Pt is from [facility]. CSW updated facility that pt ambulated 350 feet and is able to feed herself. SLP consult is pending. Discharge likely tomorrow. CSW will update ALF CSW will continue to follow.   Darden Dates, MSW, LCSW Clinical Social Worker  [phone]

## 2015-11-12 NOTE — Consult Note (Signed)
ANTIBIOTIC CONSULT NOTE -follow up  Pharmacy Consult for levofloxacin (Day 6) Indication: pneumonia  Allergies  Allergen Reactions  . Sulfa Antibiotics Hives    Patient Measurements: Height: 5\' 6"  (167.6 cm) Weight: 137 lb 12.8 oz (62.506 kg) IBW/kg (Calculated) : 59.3 Adjusted Body Weight:   Vital Signs: Temp: 98.6 F (37 C) (12/08 0818) Temp Source: Oral (12/08 0818) BP: 148/64 mmHg (12/08 0818) Pulse Rate: 80 (12/08 0818)  Labs:  Recent Labs  11/10/15 0510  11/10/15 1608 11/11/15 0500 11/12/15 0758  WBC 7.1  < > 10.1 10.9 14.8*  HGB 10.7*  < > 10.0* 9.3* 9.1*  PLT 166  < > 166 150 153  CREATININE 0.76  --   --  0.81 0.63  < > = values in this interval not displayed. Estimated Creatinine Clearance: 49 mL/min (by C-G formula based on Cr of 0.63).  Microbiology: Recent Results (from the past 720 hour(s))  Urine culture     Status: None   Collection Time: 11/06/15  9:51 PM  Result Value Ref Range Status   Specimen Description URINE, CLEAN CATCH  Final   Special Requests NONE  Final   Culture NO GROWTH 1 DAY  Final   Report Status 11/09/2015 FINAL  Final  C difficile quick scan w PCR reflex     Status: None   Collection Time: 11/09/15 12:45 PM  Result Value Ref Range Status   C Diff antigen NEGATIVE NEGATIVE Final   C Diff toxin NEGATIVE NEGATIVE Final   C Diff interpretation Negative for C. difficile  Final  Stool culture     Status: None (Preliminary result)   Collection Time: 11/09/15 12:45 PM  Result Value Ref Range Status   Specimen Description STOOL  Final   Special Requests NONE  Final   Culture   Final    HEAVY GROWTH YEAST IDENTIFICATION TO FOLLOW NO SALMONELLA OR SHIGELLA ISOLATED No Pathogenic E. coli detected NO CAMPYLOBACTER DETECTED    Report Status PENDING  Incomplete   Assessment: Pt is a 81 year old female who presents with encepalopathy, abnormal urinalysis, and PNA. Pt is from a nursing home. Pharmacy consulted to dose levofloxacin.  Pt also on zosyn Day 3 of levaquin/zosyn - Urine culture NGTD  Goal of Therapy:  resoltuion of infection  Plan:  Will continue levaquin 750mg  IV Q48H based on renal function. Recommend de-escalation of antibiotics.   12/8: Day 6 Levaquin tx. Patient received 4 days of Zosyn (d/ced on 12/6). Levaquin changed to PO on 12/7. WBC today 14.8. Tmax 99.6 12/7 1530. Crcl 49 ml/min. Patient on Ensure TIDWM. Will transition to Levaquin 750mg  po Q24h. Schedule to avoid ENSURE administration.  Chinita Greenland PharmD Clinical Pharmacist 11/12/2015 9:24 AM

## 2015-11-12 NOTE — Plan of Care (Signed)
Problem: Nutrition: Goal: Risk of aspiration will decrease Outcome: Progressing Aspiration precaution. Strict NPO. Family Aware. ST to evaluate.

## 2015-11-12 NOTE — Care Management Important Message (Signed)
Important Message  Patient Details  Name: [name] MRN: [id number] Date of Birth: Jun 18, 1928   Medicare Important Message Given:  Yes    [name] 11/12/2015, 10:35 AM

## 2015-11-12 NOTE — Progress Notes (Signed)
[facility] at [place] NAME: [name]    MR#:  [id number]  DATE OF BIRTH:  02-12-1928  SUBJECTIVE:  CHIEF COMPLAINT:  Patient has ch dementia and hallucinates per daughter.  Patient's bleeding scan is positive but hb is stable. Denies any abdominal pain . As per RN's report no other episodes of melena stool with blood, reporting the patient is choking on her food  REVIEW OF SYSTEMS:  Unobtainable because of the altered mental status  DRUG ALLERGIES:   Allergies  Allergen Reactions  . Sulfa Antibiotics Hives    VITALS:  Blood pressure 144/75, pulse 77, temperature 98 F (36.7 C), temperature source Oral, resp. rate 18, height 5\' 6"  (1.676 m), weight 62.506 kg (137 lb 12.8 oz), SpO2 99 %.  PHYSICAL EXAMINATION:  GENERAL:  81 y.o.-year-old patient lying in the bed with no acute distress.  EYES: Pupils equal, round, reactive to light and accommodation. No scleral icterus.  HEENT: Head atraumatic, normocephalic. Oropharynx and nasopharynx clear.  NECK:  Supple, no jugular venous distention. No thyroid enlargement, no tenderness.  LUNGS: Normal breath sounds bilaterally, no wheezing, rales,rhonchi or crepitation. No use of accessory muscles of respiration.  CARDIOVASCULAR: S1, S2 normal. No murmurs, rubs, or gallops.  ABDOMEN: Soft, nontender, nondistended. Bowel sounds present. No organomegaly or mass.  EXTREMITIES: No pedal edema, cyanosis, or clubbing.  NEUROLOGIC: Cranial nerves are grossly intact. Muscle strength 4/5 on rt side, at her baseline on lt side, followa few verbal commands . Sensation intact. Gait not checked. Dysarthric speech is better PSYCHIATRIC: The patient is alert and disoriented SKIN: No obvious rash, lesion, or ulcer.    LABORATORY PANEL:   CBC  Recent Labs Lab 11/12/15 0758  WBC 14.8*  HGB 9.1*  HCT 27.7*  PLT 153    ------------------------------------------------------------------------------------------------------------------  Chemistries   Recent Labs Lab 11/06/15 2124  11/12/15 0758  NA 141  < > 140  K 3.7  < > 3.8  CL 107  < > 111  CO2 27  < > 22  GLUCOSE 99  < > 112*  BUN 14  < > <5*  CREATININE 0.93  < > 0.63  CALCIUM 9.0  < > 8.4*  MG  --   --  1.8  AST 12*  --   --   ALT 9*  --   --   ALKPHOS 75  --   --   BILITOT 0.2*  --   --   < > = values in this interval not displayed. ------------------------------------------------------------------------------------------------------------------  Cardiac Enzymes  Recent Labs Lab 11/06/15 2124  TROPONINI 0.03   ------------------------------------------------------------------------------------------------------------------  RADIOLOGY:  Dg Chest 2 View  11/10/2015  CLINICAL DATA:  Shortness of breath EXAM: CHEST  2 VIEW COMPARISON:  11/09/2015 FINDINGS: Borderline cardiomegaly with stable aortic tortuosity. Negative hila. Peripheral asymmetric density on the left is likely overlapping artifact related to breast asymmetry. There is chronic coarsening of lung markings without change to suggest pneumonia or edema. No effusion or pneumothorax. No acute osseous finding. IMPRESSION: Stable.  No indication of acute cardiopulmonary disease. Electronically Signed   By: Monte Fantasia M.D.   On: 11/10/2015 16:01    EKG:   Orders placed or performed during the hospital encounter of 11/06/15  . ED EKG  . ED EKG    ASSESSMENT AND PLAN:   This is an 81 year old Caucasian female admitted for encephalopathy and possible stroke.  #. Encephalopathy: Could be from CVA/acute cystitis/pneumonia Patient is  altered, has baseline Alzheimer's dementia, improved clinically and seemed to be her at her baseline   #GI bleed Bleeding scan is postive for active hemorrhage in sigmoid colon   hb is 11 -->10 -->9.3 --> 9.1  Will resume aspirin 81 mg  as patient just had stroke and monitor hemoglobin., If stable and no other episodes of bleeding are noted will discharge the patient tomorrow  GI is not recommending colonoscopy 2/2 her comorbidities -vascular sx is not considering embolization Provide Protonix Appreciate GI and vascular sx recommendations Stool for C. difficile toxin is negative, stool cx with yeast- diflucan and f/u with ID    #. 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,  carotid Dopplers and 2-D echocardiogram nmL Appreciate Neurology recommendations As per RN report pt passed  bedside swallow evaluation  fasting lipid panel - LDL at 144,  started patient on statin discontinued aspirin 2/2 #1, but will resume asa 81 mg  today's hemoglobin is stable and no other episodes of bleeding is noticed  PT recommended home PT  #Abnormal urinalysis  urine culture ntd   #Pneumonia Will put the patient on aspiration precautions Treat her for healthcare associated pneumonia with Zosyn and levofloxacin as patient is from a healthcare facility, as clinically better d/ced zosyn and continue levoflox sputum culture and sensitivity ordered- Sample not collected so far Rpt cxr with left lower lobe infiltrate. Continue current antibiotics IS We will make the patient nothing by mouth and order swallow evaluation as there  is a concern for aspiration given her choking, coughing spells spellduring eating . We will also get chest x-ray  #Orthostatic hypotension Gentle hydration with IV fluids provided  #. Dementia: Continue Aricept and Namenda; Seroquel as needed and trazodone daily at bedtime per home regimen  #. Depression: Continue Zoloft      #D VT prophylaxis: SCDs  #Disposition home with home PT when clinically improved  The patient is a DO NOT RESUSCITATE.       All the records are reviewed and case discussed with Care Management/Social Workerr. Management plans discussed with the patient, daughter Olivia Mackie via  phone and she is in in agreement.  CODE STATUS: DO NOT RESUSCITATE  TOTAL TIME TAKING CARE OF THIS PATIENT: 35 minutes.   POSSIBLE D/C IN 1-2 DAYS, DEPENDING ON CLINICAL CONDITION.   Nicholes Mango M.D on 11/12/2015 at 2:16 PM  Between 7am to 6pm - Pager - [phone] After 6pm go to [email or web address] - password [email or web address]  [place] [place] [facility]  Office  [phone]  CC: Primary care physician; Mathews Argyle, MD

## 2015-11-13 LAB — CBC
HCT: 28.9 % — ABNORMAL LOW (ref 35.0–47.0)
Hemoglobin: 9.8 g/dL — ABNORMAL LOW (ref 12.0–16.0)
MCH: 29.2 pg (ref 26.0–34.0)
MCHC: 34 g/dL (ref 32.0–36.0)
MCV: 86 fL (ref 80.0–100.0)
PLATELETS: 161 10*3/uL (ref 150–440)
RBC: 3.37 MIL/uL — ABNORMAL LOW (ref 3.80–5.20)
RDW: 13.2 % (ref 11.5–14.5)
WBC: 10 10*3/uL (ref 3.6–11.0)

## 2015-11-13 LAB — CREATININE, SERUM
Creatinine, Ser: 0.74 mg/dL (ref 0.44–1.00)
GFR calc Af Amer: >60 mL/min
GFR calc non Af Amer: >60 mL/min

## 2015-11-13 MED ORDER — PANTOPRAZOLE SODIUM 40 MG IV SOLR: 40.0000 mg | INTRAVENOUS | Status: DC

## 2015-11-13 MED ORDER — PANTOPRAZOLE SODIUM 40 MG PO TBEC: 40.0000 mg | DELAYED_RELEASE_TABLET | Freq: Every day | ORAL | Status: AC

## 2015-11-13 MED ORDER — ZINC OXIDE 20 % EX OINT: TOPICAL_OINTMENT | CUTANEOUS | Status: AC | PRN

## 2015-11-13 MED ORDER — PANTOPRAZOLE SODIUM 40 MG PO TBEC
40.0000 mg | DELAYED_RELEASE_TABLET | Freq: Every day | ORAL | Status: DC
  Administered 2015-11-13: 40 mg via ORAL
  Filled 2015-11-13: qty 1

## 2015-11-13 MED ORDER — ASPIRIN 81 MG PO TBEC: 81.0000 mg | DELAYED_RELEASE_TABLET | Freq: Every day | ORAL | Status: AC

## 2015-11-13 MED ORDER — LEVOFLOXACIN 750 MG PO TABS: 750.0000 mg | ORAL_TABLET | Freq: Every day | ORAL | Status: DC

## 2015-11-13 MED ORDER — DOCUSATE SODIUM 100 MG PO CAPS: 100.0000 mg | ORAL_CAPSULE | Freq: Two times a day (BID) | ORAL | Status: AC

## 2015-11-13 MED ORDER — PRAVASTATIN SODIUM 40 MG PO TABS: 40.0000 mg | ORAL_TABLET | Freq: Every day | ORAL | Status: DC

## 2015-11-13 MED ORDER — ENSURE ENLIVE PO LIQD: 237.0000 mL | Freq: Three times a day (TID) | ORAL | Status: AC

## 2015-11-13 NOTE — NC FL2 (Signed)
[place] LEVEL OF CARE SCREENING TOOL     IDENTIFICATION  Patient Name: [name] Birthdate: [birth year] Sex: female Admission Date (Current Location): 11/06/2015  [place] and [place] Number: [facility] and Address:  [facility], [address] [place], [place] [address]      Provider Number: [id number]  Attending Physician Name and Address:  Nicholes Mango, MD  Relative Name and Phone Number:       Current Level of Care: Hospital Recommended Level of Care: Memory Care Prior Approval Number:    Date Approved/Denied:   PASRR Number:    Discharge Plan: Other (Comment) (Secure Unit, Memory Care)    Current Diagnoses: Patient Active Problem List   Diagnosis Date Noted  . CVA (cerebral infarction) 11/08/2015  . Encephalopathy 11/07/2015    Orientation RESPIRATION BLADDER Height & Weight    Self  Normal Incontinent 5\' 6"  (167.6 cm) 132 lbs.  BEHAVIORAL SYMPTOMS/MOOD NEUROLOGICAL BOWEL NUTRITION STATUS      Incontinent Diet (DYS 2, Nectar Thick)  AMBULATORY STATUS COMMUNICATION OF NEEDS Skin   Supervision Verbally Normal                       Personal Care Assistance Level of Assistance  Bathing, Feeding, Dressing Bathing Assistance: Limited assistance Feeding assistance: Limited assistance Dressing Assistance: Limited assistance     Functional Limitations Info  Sight, Hearing, Speech Sight Info: Adequate Hearing Info: Impaired Speech Info: Adequate    SPECIAL CARE FACTORS FREQUENCY  PT (By licensed PT)     PT Frequency: 3              Contractures      Additional Factors Info  Code Status, Allergies Code Status Info: DNR Allergies Info: Sulfa Antibiotics           Current Medications (11/13/2015):  This is the current hospital active medication list Current Facility-Administered Medications  Medication Dose Route Frequency Provider Last Rate Last Dose  . acetaminophen  (TYLENOL) tablet 650 mg  650 mg Oral Q6H PRN Harrie Foreman, MD       Or  . acetaminophen (TYLENOL) suppository 650 mg  650 mg Rectal Q6H PRN Harrie Foreman, MD      . aspirin EC tablet 81 mg  81 mg Oral Daily Nicholes Mango, MD   81 mg at 11/12/15 1238  . docusate sodium (COLACE) capsule 100 mg  100 mg Oral BID Harrie Foreman, MD   100 mg at 11/13/15 1100  . donepezil (ARICEPT) tablet 10 mg  10 mg Oral QHS Harrie Foreman, MD   10 mg at 11/11/15 2145  . feeding supplement (ENSURE ENLIVE) (ENSURE ENLIVE) liquid 237 mL  237 mL Oral TID WC Nicholes Mango, MD   237 mL at 11/13/15 1103  . fluconazole (DIFLUCAN) tablet 100 mg  100 mg Oral Daily Nicholes Mango, MD   100 mg at 11/13/15 1100  . fluticasone (FLONASE) 50 MCG/ACT nasal spray 1 spray  1 spray Each Nare Daily Harrie Foreman, MD   1 spray at 11/13/15 1000  . levofloxacin (LEVAQUIN) tablet 750 mg  750 mg Oral Daily Nicholes Mango, MD   750 mg at 11/13/15 1100  . loratadine (CLARITIN) tablet 10 mg  10 mg Oral Daily Harrie Foreman, MD   10 mg at 11/13/15 1100  . memantine (NAMENDA XR) 24 hr capsule 28 mg  28 mg Oral Daily Harrie Foreman, MD  28 mg at 11/13/15 1000  . metoprolol tartrate (LOPRESSOR) tablet 25 mg  25 mg Oral BID Harrie Foreman, MD   25 mg at 11/13/15 1000  . ondansetron (ZOFRAN) tablet 4 mg  4 mg Oral Q6H PRN Harrie Foreman, MD       Or  . ondansetron [facility]) injection 4 mg  4 mg Intravenous Q6H PRN Harrie Foreman, MD      . pantoprazole (PROTONIX) EC tablet 40 mg  40 mg Oral Daily Nicholes Mango, MD   40 mg at 11/13/15 1105  . piperacillin-tazobactam (ZOSYN) IVPB 3.375 g  3.375 g Intravenous Q8H Nicholes Mango, MD   3.375 g at 11/13/15 0346  . pravastatin (PRAVACHOL) tablet 40 mg  40 mg Oral q1800 Nicholes Mango, MD   40 mg at 11/11/15 1708  . QUEtiapine (SEROQUEL) tablet 200 mg  200 mg Oral BID PRN Harrie Foreman, MD   200 mg at 11/11/15 0400  . sertraline (ZOLOFT) tablet 100 mg  100 mg Oral Daily Harrie Foreman,  MD   100 mg at 11/13/15 1059  . sodium chloride 0.9 % injection 3 mL  3 mL Intravenous PRN Nicholes Mango, MD   3 mL at 11/09/15 2207  . traZODone (DESYREL) tablet 50 mg  50 mg Oral QHS Harrie Foreman, MD   50 mg at 11/11/15 2145  . zinc oxide 20 % ointment   Topical PRN Harrie Foreman, MD         Discharge Medications: DISCHARGE MEDICATIONS:   Current Discharge Medication List    START taking these medications   Details  aspirin EC 81 MG EC tablet Take 1 tablet (81 mg total) by mouth daily. Qty: 30 tablet, Refills: 0    docusate sodium (COLACE) 100 MG capsule Take 1 capsule (100 mg total) by mouth 2 (two) times daily. Qty: 10 capsule, Refills: 0    feeding supplement, ENSURE ENLIVE, (ENSURE ENLIVE) LIQD Take 237 mLs by mouth 3 (three) times daily with meals. Qty: 237 mL, Refills: 12    levofloxacin (LEVAQUIN) 750 MG tablet Take 1 tablet (750 mg total) by mouth daily. Qty: 5 tablet, Refills: 0    pantoprazole (PROTONIX) 40 MG tablet Take 1 tablet (40 mg total) by mouth daily. Qty: 30 tablet, Refills: 0    pravastatin (PRAVACHOL) 40 MG tablet Take 1 tablet (40 mg total) by mouth daily at 6 PM. Qty: 30 tablet, Refills: 0    zinc oxide 20 % ointment Apply topically as needed for irritation. Qty: 56.7 g, Refills: 0      CONTINUE these medications which have NOT CHANGED   Details  cetirizine (ZYRTEC) 10 MG tablet Take 10 mg by mouth daily as needed for allergies.    donepezil (ARICEPT) 10 MG tablet Take 10 mg by mouth at bedtime.    fluticasone (FLONASE) 50 MCG/ACT nasal spray Place 1 spray into both nostrils daily.    memantine (NAMENDA XR) 28 MG CP24 24 hr capsule Take 28 mg by mouth daily.    Menthol-Zinc Oxide (RISAMINE) 0.44-20.625 % OINT Apply 1 application topically as needed (to affected areas).    metoprolol tartrate (LOPRESSOR) 25 MG tablet Take 25 mg by mouth 2 (two) times daily.    QUEtiapine (SEROQUEL) 200  MG tablet Take 200 mg by mouth 2 (two) times daily as needed (for agitation).    sertraline (ZOLOFT) 100 MG tablet Take 100 mg by mouth daily.    traZODone (DESYREL) 50 MG tablet  Take 50 mg by mouth at bedtime.          Relevant Imaging Results:  Relevant Lab Results:   Additional [place], [place]

## 2015-11-13 NOTE — Clinical Social Work Note (Signed)
Pt is ready for discharge today to [facility]. Facility has all discharge information. Pt's daughter, Olivia Mackie is aware and agreeable to discharge plan. EMS to provide transportation. CSW is signing off as no further needs identified.   Darden Dates, MSW, LCSW Clinical Social Worker  [phone]

## 2015-11-13 NOTE — Plan of Care (Signed)
Problem: Education: Goal: Knowledge of  General Education information/materials will improve Outcome: Progressing General Education Handout provided to patient. Oriented to Oncology unit. Instructed and Demonstrated how to use phone to call RN and CNA for Assistance. Verbalized and Demonstrated Understanding.  Problem: Education: Goal: Knowledge of disease or condition will improve Outcome: Progressing Alert. Oriented to self.  OOB to [facility] with assist. Also, incontinent at times of bowel and bladder.   Safety sitter remains at bedside. Distraction provided with music and television. Denies pain or discomfort.   Problem: Nutrition: Goal: Risk of aspiration will decrease Outcome: Progressing Remains on Aspiration Precautions. Speech Therapy Evaluation. Dysphagia II diet.  Nectar Thickened Liquids. HOB elevated during meals. Remains upright 30-45 minutes after meals.

## 2015-11-13 NOTE — Plan of Care (Signed)
Problem: Education: Goal: Knowledge of disease or condition will improve Outcome: Progressing Pt remains alert to self, incontinent of bowel and bladder.  Safety sitter remains at bedside. Distraction provided with television. Pt does not c/o any pain or discomfort.   Problem: Nutrition: Goal: Risk of aspiration will decrease Outcome: Progressing Pt continues to be strictly NPO at this time.  Remains on Aspiration precautions

## 2015-11-13 NOTE — Consult Note (Signed)
ANTIBIOTIC CONSULT NOTE -follow up  Pharmacy Consult for levofloxacin (Day 7) Indication: pneumonia/?HCAP/?aspiration  Allergies  Allergen Reactions  . Sulfa Antibiotics Hives    Patient Measurements: Height: 5\' 6"  (167.6 cm) Weight: 132 lb 11.2 oz (60.192 kg) IBW/kg (Calculated) : 59.3 Adjusted Body Weight:   Vital Signs: Temp: 98.9 F (37.2 C) (12/09 0400) Temp Source: Oral (12/09 0400) BP: 152/76 mmHg (12/09 0400) Pulse Rate: 84 (12/09 0401)  Labs:  Recent Labs  11/11/15 0500 11/12/15 0758 11/13/15 0533  WBC 10.9 14.8* 10.0  HGB 9.3* 9.1* 9.8*  PLT 150 153 161  CREATININE 0.81 0.63 0.74   Estimated Creatinine Clearance: 49 mL/min (by C-G formula based on Cr of 0.74).  Microbiology: Recent Results (from the past 720 hour(s))  Urine culture     Status: None   Collection Time: 11/06/15  9:51 PM  Result Value Ref Range Status   Specimen Description URINE, CLEAN CATCH  Final   Special Requests NONE  Final   Culture NO GROWTH 1 DAY  Final   Report Status 11/09/2015 FINAL  Final  C difficile quick scan w PCR reflex     Status: None   Collection Time: 11/09/15 12:45 PM  Result Value Ref Range Status   C Diff antigen NEGATIVE NEGATIVE Final   C Diff toxin NEGATIVE NEGATIVE Final   C Diff interpretation Negative for C. difficile  Final  Stool culture     Status: None   Collection Time: 11/09/15 12:45 PM  Result Value Ref Range Status   Specimen Description STOOL  Final   Special Requests NONE  Final   Culture   Final    HEAVY GROWTH CANDIDA ALBICANS NO SALMONELLA OR SHIGELLA ISOLATED No Pathogenic E. coli detected NO CAMPYLOBACTER DETECTED NORMAL FLORA ABSENT    Report Status 11/12/2015 FINAL  Final   Assessment: Pt is a 81 year old female who presents with encepalopathy, abnormal urinalysis, and PNA. Pt is from a nursing home. ?HCAP. Pharmacy consulted to dose levofloxacin.   12/2 Urine cx- NGTD  12/2 CXR-bibasilar opacities, cannot exclude early  aspiration 12/5 CXR-?early infiltrate 12/6 CXR- negative 12/8 CXR- negative  12/5 Stool cx- heavy growth Candida yeast  Goal of Therapy:  resoltuion of infection  Plan:  Will continue levaquin 750mg  IV Q48H based on renal function. Recommend de-escalation of antibiotics.   12/8: Day 6 Levaquin tx. Patient received 4 days of Zosyn (d/ced on 12/6). Levaquin changed to PO on 12/7. WBC today 14.8. Tmax 99.6 12/7 1530. Crcl 49 ml/min. Patient on Ensure TIDWM. Will transition to Levaquin 750mg  po Q24h. Schedule to avoid ENSURE administration.  12/9- Patient on Levaquin 750mg  PO Q24h Day 7. MD wanted to resume Zosyn yesterday. (patient now on Zosyn EI 3.375gm IV q8h again and Fluconazole po also) SLP consult pending. Patient choking on food as witnessed by MD.   Lajean Silvius given 12/3>>12/6 then resumed 12/8. Levaquin started 12/3>> Fluconazole started 12/6>>  Chinita Greenland PharmD Clinical Pharmacist 11/13/2015 8:29 AM

## 2015-11-13 NOTE — Discharge Instructions (Signed)
Activity as tolerated Diet dysphagia type II with nectar thick liquids and ensure with each meal Follow-up with primary care physician in 3-5 days Follow-up with neurology in 4 weeks Follow-up with gastroenterology in 4 weeks

## 2015-11-13 NOTE — Evaluation (Signed)
Clinical/Bedside Swallow Evaluation Patient Details  Name: [name] MRN: [id number] Date of Birth: [birth year]  Today's Date: 11/13/2015 Time: SLP Start Time (ACUTE ONLY): 0810 SLP Stop Time (ACUTE ONLY): 0910 SLP Time Calculation (min) (ACUTE ONLY): 60 min  Past Medical History:  Past Medical History  Diagnosis Date  . Cancer ([place])   . Heart murmur   . Breast cancer [facility])     h/o   Past Surgical History:  Past Surgical History  Procedure Laterality Date  . Mastectomy  Rt side    1998  . Bladder tack    . Rotator cuff repair Right    HPI:  Pt w/ h/o Dementia(on aricept and namenda per chart), depression, and breast Ca presented to the emergency department from her assisted living facility when staff noticed that she was weak on her right side and had a possible right facial droop as well as slurred speech. The patient denies complaints. Initial valuation emergency department revealed no focal neurologic deficits although the patient did not (or could not) cooperate with full neurologic exam. CT of the head revealed some questionable areas of attenuation left frontal parietal lobe as well as a remote lacunar infarct on the left. There has been concern for GI bleeding during this admission. Reviewed MRI results(Moderate-sized acute LEFT MCA infarction affecting much of the LEFT). Pt presented w/ clear, intelligible speech and followed all general instructions. Pt was easily distracted w/ confusion which impacted her overall communication - unsure if baseline for pt sec. to her Dementia.    Assessment / Plan / Recommendation Clinical Impression  Pt appears to present w/ reduced toleration of thin liquids evidenced by delayed throat clearing and coughing during trials of thin liquids via cup following general aspiration precautions. When pt consumed trials of Nectar liquids and purees/softened solids, no overt s/s of aspiration noted; no decline in respiratory status during/post trials.  No oral phase defiicits noted w/ same trials; assistance w/ feeding given sec. to Cognitive status and requiring verbal cues to redirect to task. Pt appears at reduced risk for aspiration w/ po's of Nectar consistency liquids and Dys. 2 diet consistency. Rec. such diet w/ aspiration precautions; meds in puree; assistance at meals; reduce distractions. Rec. f/u w/ Dietician for supplement support for nutrition as pt declined po's. NSG and MD updated.     Aspiration Risk  Mild aspiration risk (-moderrate)    Diet Recommendation  Dys. 2(moistened well); Nectar consistency liquids; aspiration precautions; reduce distractions.  Medication Administration: Whole meds with puree    Other  Recommendations Recommended Consults:  (Dietician) Oral Care Recommendations: Oral care BID;Staff/trained caregiver to provide oral care Other Recommendations: Order thickener from pharmacy;Prohibited food (jello, ice cream, thin soups);Remove water pitcher   Follow up Recommendations  Skilled Nursing facility (for f/u w/ dysphagia assessment and education); f/u for any cognitive-linguistic assessment sec. to CVA.    Frequency and Duration min 3x week  1 week       Prognosis Prognosis for Safe Diet Advancement: Fair Barriers to Reach Goals: Cognitive deficits      Swallow Study   General Date of Onset: 11/06/15 HPI: Pt w/ h/o Dementia(on aricept and namenda per chart), depression, and breast Ca presented to the emergency department from her assisted living facility when staff noticed that she was weak on her right side and had a possible right facial droop as well as slurred speech. The patient denies complaints. Initial valuation emergency department revealed no focal neurologic deficits although the patient did  not (or could not) cooperate with full neurologic exam. CT of the head revealed some questionable areas of attenuation left frontal parietal lobe as well as a remote lacunar infarct on the left. There  has been concern for GI bleeding during this admission. Reviewed MRI results(Moderate-sized acute LEFT MCA infarction affecting much of the LEFT). Pt presented w/ clear, intelligible speech and followed all general instructions. Pt was easily distracted w/ confusion which impacted her overall communication - unsure if baseline for pt sec. to her Dementia.  Type of Study: Bedside Swallow Evaluation Previous Swallow Assessment: none Diet Prior to this Study: Regular;Thin liquids (per report) Temperature Spikes Noted: No (wbc 10.0) Respiratory Status: Room air History of Recent Intubation: No Behavior/Cognition: Cooperative;Pleasant mood;Confused;Requires cueing;Distractible (awake) Oral Cavity Assessment: Within Functional Limits;Dry Oral Care Completed by SLP: Yes Oral Cavity - Dentition: Missing dentition (some dentition) Vision: Functional for self-feeding Self-Feeding Abilities: Able to feed self;Needs assist;Needs set up Patient Positioning: Upright in bed Baseline Vocal Quality: Normal Volitional Cough: Strong Volitional Swallow: Able to elicit    Oral/Motor/Sensory Function Overall Oral Motor/Sensory Function: Within functional limits (grossly wfl - no unilateral decreased tone/ROM/strength)   Ice Chips Ice chips: Within functional limits Presentation: Spoon (fed; 3 trials)   Thin Liquid Thin Liquid: Impaired Presentation: Cup;Self Fed (assisted; 3 trials) Oral Phase Impairments:  (none) Oral Phase Functional Implications:  (none) Pharyngeal  Phase Impairments: Cough - Delayed;Throat Clearing - Delayed (x2/3 trials)    Nectar Thick Nectar Thick Liquid: Within functional limits Presentation: Cup;Self Fed (assisted intermittetnly; 6 ozs) Other Comments: no change in vocal quality or respiratory status   Honey Thick Honey Thick Liquid: Not tested   Puree Puree: Within functional limits Presentation: Self Fed;Spoon (assisted; 2-3 ozs)   Solid Solid: Within functional  limits Presentation: Spoon;Self Fed (assisted; 3 trials) Other Comments: softened the solid moderately      Orinda Kenner, MS, CCC-SLP  Watson,Katherine 11/13/2015,10:46 AM

## 2015-11-13 NOTE — Discharge Summary (Addendum)
[place] [facility] [place] NAME: [name]    MR#:  [id number]  DATE OF BIRTH:  [birth year]  DATE OF ADMISSION:  11/06/2015 ADMITTING PHYSICIAN: Harrie Foreman, MD  DATE OF DISCHARGE: 11/13/2015 PRIMARY CARE PHYSICIAN: Mathews Argyle, MD    ADMISSION DIAGNOSIS:  Encephalopathy [G93.40] Opacity of lung on imaging study [R91.8]  DISCHARGE DIAGNOSIS:  Active Problems:   Encephalopathy   CVA (cerebral infarction)   SECONDARY DIAGNOSIS:   Past Medical History  Diagnosis Date  . Cancer ([place])   . Heart murmur   . Breast cancer ([place])     h/o   GI bleed PNA HOSPITAL COURSE:   This is an 81 year old Caucasian female admitted for encephalopathy and possible stroke.  #. Encephalopathy: Could be from CVA/pneumonia Patient is altered, has baseline Alzheimer's dementia, improved clinically and  at her baseline per her daughter's   #GI bleed Bleeding scan is postive for active hemorrhage in sigmoid colon  hb is 11 -->10 -->9.3 --> 9.1 -->9.8  resumed aspirin 81 mg as patient just had stroke and monitor hemoglobin closely, hemoglobin was 9.1 yesterday and today is at 9.8., I and no other episodes of bleeding are noted ,will discharge the patient today GI is not recommending colonoscopy 2/2 her comorbidities -vascular sx is not considering embolization Provide Protonix Appreciate GI and vascular sx recommendations Stool for C. difficile toxin is negative, stool cx with yeast- diflucan and f/u with ID    #. 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, carotid Dopplers and 2-D echocardiogram nmL Appreciate Neurology recommendations As per RN report pt passed bedside swallow evaluation fasting lipid panel - LDL at 144, started patient on statin discontinued aspirin 2/2 #1, but resumed asa 81 mg 11/12/15 . hemoglobin is stable and no other episodes of bleeding is noticed  Swallow evaluation is done and speech  pathology has recommended dysphagia type II diet with nectar thick liquids and ensure with each meal  #Abnormal urinalysis urine culture ntd   #Pneumonia Will put the patient on aspiration precautions Treated in her for healthcare associated pneumonia with Zosyn and levofloxacin as patient is from a healthcare facility, as clinically better d/ced zosyn and continue levoflox sputum culture and sensitivity ordered- Sample not collected so far Rpt cxr with left lower lobe infiltrate., And other chest x-ray yesterday on 12/29 with no active pulmonary disease. Continue current antibiotics IS  #Orthostatic hypotension Gentle hydration with IV fluids provided  #. Dementia: Continue Aricept and Namenda; Seroquel as needed and trazodone daily at bedtime per home regimen  #. Depression: Continue Zoloft     #D VT prophylaxis: SCDs  #Disposition home with home PT when clinically improved   DISCHARGE CONDITIONS:   fair  CONSULTS OBTAINED:  Treatment Team:  Nicholes Mango, MD Leotis Pain, MD Manya Silvas, MD Algernon Huxley, MD Katha Cabal, MD   PROCEDURES bleeding scan positive  DRUG ALLERGIES:   Allergies  Allergen Reactions  . Sulfa Antibiotics Hives    DISCHARGE MEDICATIONS:   Current Discharge Medication List    START taking these medications   Details  aspirin EC 81 MG EC tablet Take 1 tablet (81 mg total) by mouth daily. Qty: 30 tablet, Refills: 0    docusate sodium (COLACE) 100 MG capsule Take 1 capsule (100 mg total) by mouth 2 (two) times daily. Qty: 10 capsule, Refills: 0    feeding supplement, ENSURE ENLIVE, (ENSURE ENLIVE) LIQD Take 237  mLs by mouth 3 (three) times daily with meals. Qty: 237 mL, Refills: 12    levofloxacin (LEVAQUIN) 750 MG tablet Take 1 tablet (750 mg total) by mouth daily. Qty: 5 tablet, Refills: 0    pantoprazole (PROTONIX) 40 MG tablet Take 1 tablet (40 mg total) by mouth daily. Qty: 30 tablet, Refills: 0    pravastatin  (PRAVACHOL) 40 MG tablet Take 1 tablet (40 mg total) by mouth daily at 6 PM. Qty: 30 tablet, Refills: 0    zinc oxide 20 % ointment Apply topically as needed for irritation. Qty: 56.7 g, Refills: 0      CONTINUE these medications which have NOT CHANGED   Details  cetirizine (ZYRTEC) 10 MG tablet Take 10 mg by mouth daily as needed for allergies.    donepezil (ARICEPT) 10 MG tablet Take 10 mg by mouth at bedtime.    fluticasone (FLONASE) 50 MCG/ACT nasal spray Place 1 spray into both nostrils daily.    memantine (NAMENDA XR) 28 MG CP24 24 hr capsule Take 28 mg by mouth daily.    Menthol-Zinc Oxide (RISAMINE) 0.44-20.625 % OINT Apply 1 application topically as needed (to affected areas).    metoprolol tartrate (LOPRESSOR) 25 MG tablet Take 25 mg by mouth 2 (two) times daily.    QUEtiapine (SEROQUEL) 200 MG tablet Take 200 mg by mouth 2 (two) times daily as needed (for agitation).    sertraline (ZOLOFT) 100 MG tablet Take 100 mg by mouth daily.    traZODone (DESYREL) 50 MG tablet Take 50 mg by mouth at bedtime.         DISCHARGE INSTRUCTIONS:  Activity as tolerated Follow-up with primary care physician in 3-5 days Follow-up with neurology in 4 weeks Follow-up with gastroenterology in 4 weeks    DIET:   dysphagia type II diet with nectar thick liquids and ensure with each meal   DISCHARGE CONDITION:  Fair  ACTIVITY:  Activity as tolerated  OXYGEN:  Home Oxygen: No.   Oxygen Delivery: room air  DISCHARGE LOCATION:  nursing home   If you experience worsening of your admission symptoms, develop shortness of breath, life threatening emergency, suicidal or homicidal thoughts you must seek medical attention immediately by calling 911 or calling your MD immediately  if symptoms less severe.  You Must read complete instructions/literature along with all the possible adverse reactions/side effects for all the Medicines you take and that have been prescribed to you. Take  any new Medicines after you have completely understood and accpet all the possible adverse reactions/side effects.   Please note  You were cared for by a hospitalist during your hospital stay. If you have any questions about your discharge medications or the care you received while you were in the hospital after you are discharged, you can call the unit and asked to speak with the hospitalist on call if the hospitalist that took care of you is not available. Once you are discharged, your primary care physician will handle any further medical issues. Please note that NO REFILLS for any discharge medications will be authorized once you are discharged, as it is imperative that you return to your primary care physician (or establish a relationship with a primary care physician if you do not have one) for your aftercare needs so that they can reassess your need for medications and monitor your lab values.     Today  Chief Complaint  Patient presents with  . Altered Mental Status   Patient is doing better.  No overnight events. No other episodes of blood in the stool per medical staff  ROS: Unobtainable as the patient is demented   VITAL SIGNS:  Blood pressure 130/79, pulse 103, temperature 99.3 F (37.4 C), temperature source Oral, resp. rate 19, height 5\' 6"  (1.676 m), weight 60.192 kg (132 lb 11.2 oz), SpO2 98 %.  I/O:    Intake/Output Summary (Last 24 hours) at 11/13/15 1234 Last data filed at 11/12/15 1500  Gross per 24 hour  Intake 961.67 ml  Output      0 ml  Net 961.67 ml    PHYSICAL EXAMINATION:  GENERAL:  81 y.o.-year-old patient lying in the bed with no acute distress.  EYES: Pupils equal, round, reactive to light and accommodation. No scleral icterus. Extraocular muscles intact.  HEENT: Head atraumatic, normocephalic. Oropharynx and nasopharynx clear.  NECK:  Supple, no jugular venous distention. No thyroid enlargement, no tenderness.  LUNGS: Normal breath sounds  bilaterally, no wheezing, rales,rhonchi or crepitation. No use of accessory muscles of respiration.  CARDIOVASCULAR: S1, S2 normal. No murmurs, rubs, or gallops.  ABDOMEN: Soft, non-tender, non-distended. Bowel sounds present. No organomegaly or mass.  EXTREMITIES: No pedal edema, cyanosis, or clubbing.  NEUROLOGIC: Cranial nerves II through XII are grossly intact. Right-sided weakness is present . Sensation intact. Gait not checked.  PSYCHIATRIC: The patient is alert and oriented x 1, patient is chronically demented.  SKIN: No obvious rash, lesion, or ulcer.   DATA REVIEW:   CBC  Recent Labs Lab 11/13/15 0533  WBC 10.0  HGB 9.8*  HCT 28.9*  PLT 161    Chemistries   Recent Labs Lab 11/06/15 2124  11/12/15 0758 11/13/15 0533  NA 141  < > 140  --   K 3.7  < > 3.8  --   CL 107  < > 111  --   CO2 27  < > 22  --   GLUCOSE 99  < > 112*  --   BUN 14  < > <5*  --   CREATININE 0.93  < > 0.63 0.74  CALCIUM 9.0  < > 8.4*  --   MG  --   --  1.8  --   AST 12*  --   --   --   ALT 9*  --   --   --   ALKPHOS 75  --   --   --   BILITOT 0.2*  --   --   --   < > = values in this interval not displayed.  Cardiac Enzymes  Recent Labs Lab 11/06/15 2124  TROPONINI 0.03    Microbiology Results  Results for orders placed or performed during the hospital encounter of 11/06/15  Urine culture     Status: None   Collection Time: 11/06/15  9:51 PM  Result Value Ref Range Status   Specimen Description URINE, CLEAN CATCH  Final   Special Requests NONE  Final   Culture NO GROWTH 1 DAY  Final   Report Status 11/09/2015 FINAL  Final  C difficile quick scan w PCR reflex     Status: None   Collection Time: 11/09/15 12:45 PM  Result Value Ref Range Status   C Diff antigen NEGATIVE NEGATIVE Final   C Diff toxin NEGATIVE NEGATIVE Final   C Diff interpretation Negative for C. difficile  Final  Stool culture     Status: None   Collection Time: 11/09/15 12:45 PM  Result Value Ref Range  Status  Specimen Description STOOL  Final   Special Requests NONE  Final   Culture   Final    HEAVY GROWTH CANDIDA ALBICANS NO SALMONELLA OR SHIGELLA ISOLATED No Pathogenic E. coli detected NO CAMPYLOBACTER DETECTED NORMAL FLORA ABSENT    Report Status 11/12/2015 FINAL  Final    RADIOLOGY:  Dg Chest 2 View  11/12/2015  CLINICAL DATA:  Subsequent evaluation for cough EXAM: CHEST  2 VIEW COMPARISON:  11/10/2015, 11/09/2015 FINDINGS: Stable mild cardiac enlargement. Mild diffuse interstitial prominence, stable from prior studies. No consolidation or effusion. IMPRESSION: No active cardiopulmonary disease. Electronically Signed   By: Skipper Cliche M.D.   On: 11/12/2015 16:34   Dg Chest 2 View  11/10/2015  CLINICAL DATA:  Shortness of breath EXAM: CHEST  2 VIEW COMPARISON:  11/09/2015 FINDINGS: Borderline cardiomegaly with stable aortic tortuosity. Negative hila. Peripheral asymmetric density on the left is likely overlapping artifact related to breast asymmetry. There is chronic coarsening of lung markings without change to suggest pneumonia or edema. No effusion or pneumothorax. No acute osseous finding. IMPRESSION: Stable.  No indication of acute cardiopulmonary disease. Electronically Signed   By: Monte Fantasia M.D.   On: 11/10/2015 16:01   Nm Gi Blood Loss  11/09/2015  CLINICAL DATA:  Gastrointestinal bleeding EXAM: NUCLEAR MEDICINE GASTROINTESTINAL BLEEDING SCAN TECHNIQUE: Sequential abdominal images were obtained following intravenous administration of Tc-9m labeled red blood cells. RADIOPHARMACEUTICALS:  21.31 mCi Tc-46m in-vitro labeled red cells. COMPARISON:  None. FINDINGS: On the initial 1 hour imaging, there is a curvilinear area of increased activity identified which increases within the second hour consistent with focal hemorrhage within the sigmoid colon. No other focal areas of hemorrhage are seen. IMPRESSION: Active hemorrhage within the sigmoid colon. Electronically Signed    By: Inez Catalina M.D.   On: 11/09/2015 21:31    EKG:   Orders placed or performed during the hospital encounter of 11/06/15  . ED EKG  . ED EKG      Management plans discussed with the patient, family and they are in agreement.  CODE STATUS:     Code Status Orders        Start     Ordered   11/07/15 1537  Do not attempt resuscitation (DNR)   Continuous    Question Answer Comment  In the event of cardiac or respiratory ARREST Do not call a "code blue"   In the event of cardiac or respiratory ARREST Do not perform Intubation, CPR, defibrillation or ACLS   In the event of cardiac or respiratory ARREST Use medication by any route, position, wound care, and other measures to relive pain and suffering. May use oxygen, suction and manual treatment of airway obstruction as needed for comfort.   Comments RN may pronounce      11/07/15 1536    Advance Directive Documentation        Most Recent Value   Type of Advance Directive  Healthcare Power of Attorney, Living will   Pre-existing out of facility DNR order (yellow form or pink MOST form)     "MOST" Form in Place?        TOTAL TIME TAKING CARE OF THIS PATIENT: 45 minutes.    @[facility] @  on 11/13/2015 at 12:34 PM  Between 7am to 6pm - Pager - [phone]  After 6pm go to [email or web address] - password [email or web address] [facility]  [facility]  Office  [phone]  CC: Primary care physician; Mathews Argyle, MD

## 2015-11-13 NOTE — Progress Notes (Signed)
MD making rounds. Discharge orders received. Clinical Education officer, museum (CSW) facilitating discharge to [facility]. Discharge packet being prepared. IV removed. Vital Signs Stable. No signs and/or symptoms of distress noted. Denies pain. Denies needs. EMS called for transport. Awaiting EMS. EMS on Unit for transport. Discharge Packet given to EMS transporter. Belongings sent with family.

## 2015-11-13 NOTE — Progress Notes (Signed)
Called by pharmacy with question about pt abx coverage.  Pt is on zosyn and levaquin.  Last prog note discussed d/c zosyn given pt clinical improvement?  Pharmacy requests day team clarify.  Jacqulyn Bath [facility] 11/13/2015, 12:49 AM

## 2015-11-23 ENCOUNTER — Observation Stay
Admission: EM | Admit: 2015-11-23 | Discharge: 2015-11-24 | Payer: Medicare Other | Attending: Specialist | Admitting: Specialist

## 2015-11-23 ENCOUNTER — Encounter: Payer: Self-pay | Admitting: Emergency Medicine

## 2015-11-23 DIAGNOSIS — Z853 Personal history of malignant neoplasm of breast: Secondary | ICD-10-CM | POA: Diagnosis not present

## 2015-11-23 DIAGNOSIS — W19XXXA Unspecified fall, initial encounter: Secondary | ICD-10-CM | POA: Diagnosis present

## 2015-11-23 DIAGNOSIS — R0602 Shortness of breath: Secondary | ICD-10-CM | POA: Diagnosis present

## 2015-11-23 DIAGNOSIS — G9340 Encephalopathy, unspecified: Secondary | ICD-10-CM | POA: Insufficient documentation

## 2015-11-23 DIAGNOSIS — F329 Major depressive disorder, single episode, unspecified: Secondary | ICD-10-CM | POA: Insufficient documentation

## 2015-11-23 DIAGNOSIS — Z79899 Other long term (current) drug therapy: Secondary | ICD-10-CM | POA: Insufficient documentation

## 2015-11-23 DIAGNOSIS — Z806 Family history of leukemia: Secondary | ICD-10-CM | POA: Diagnosis not present

## 2015-11-23 DIAGNOSIS — K219 Gastro-esophageal reflux disease without esophagitis: Secondary | ICD-10-CM | POA: Insufficient documentation

## 2015-11-23 DIAGNOSIS — R011 Cardiac murmur, unspecified: Secondary | ICD-10-CM | POA: Diagnosis not present

## 2015-11-23 DIAGNOSIS — Z882 Allergy status to sulfonamides status: Secondary | ICD-10-CM | POA: Diagnosis not present

## 2015-11-23 DIAGNOSIS — E785 Hyperlipidemia, unspecified: Secondary | ICD-10-CM | POA: Insufficient documentation

## 2015-11-23 DIAGNOSIS — E876 Hypokalemia: Secondary | ICD-10-CM | POA: Diagnosis not present

## 2015-11-23 DIAGNOSIS — Z8249 Family history of ischemic heart disease and other diseases of the circulatory system: Secondary | ICD-10-CM | POA: Insufficient documentation

## 2015-11-23 DIAGNOSIS — Z8673 Personal history of transient ischemic attack (TIA), and cerebral infarction without residual deficits: Secondary | ICD-10-CM | POA: Diagnosis not present

## 2015-11-23 DIAGNOSIS — Z7982 Long term (current) use of aspirin: Secondary | ICD-10-CM | POA: Insufficient documentation

## 2015-11-23 DIAGNOSIS — F0390 Unspecified dementia without behavioral disturbance: Secondary | ICD-10-CM | POA: Insufficient documentation

## 2015-11-23 DIAGNOSIS — R748 Abnormal levels of other serum enzymes: Secondary | ICD-10-CM | POA: Diagnosis not present

## 2015-11-23 HISTORY — DX: Pneumonia, unspecified organism: J18.9

## 2015-11-23 HISTORY — DX: Unspecified dementia, unspecified severity, without behavioral disturbance, psychotic disturbance, mood disturbance, and anxiety: F03.90

## 2015-11-23 HISTORY — DX: Cerebral infarction, unspecified: I63.9

## 2015-11-23 HISTORY — DX: Gastrointestinal hemorrhage, unspecified: K92.2

## 2015-11-23 LAB — CBC WITH DIFFERENTIAL/PLATELET
BASOS ABS: 0 10*3/uL (ref 0–0.1)
Basophils Relative: 1 %
EOS PCT: 0 %
Eosinophils Absolute: 0 10*3/uL (ref 0–0.7)
HEMATOCRIT: 28.9 % — AB (ref 35.0–47.0)
HEMOGLOBIN: 9.2 g/dL — AB (ref 12.0–16.0)
LYMPHS ABS: 2.1 10*3/uL (ref 1.0–3.6)
LYMPHS PCT: 22 %
MCH: 26.9 pg (ref 26.0–34.0)
MCHC: 32 g/dL (ref 32.0–36.0)
MCV: 84.2 fL (ref 80.0–100.0)
Monocytes Absolute: 1.7 10*3/uL — ABNORMAL HIGH (ref 0.2–0.9)
Monocytes Relative: 19 %
NEUTROS ABS: 5.5 10*3/uL (ref 1.4–6.5)
NEUTROS PCT: 58 %
Platelets: 232 10*3/uL (ref 150–440)
RBC: 3.43 MIL/uL — AB (ref 3.80–5.20)
RDW: 13.5 % (ref 11.5–14.5)
WBC: 9.4 10*3/uL (ref 3.6–11.0)

## 2015-11-23 LAB — COMPREHENSIVE METABOLIC PANEL
ALT: 7 U/L — AB (ref 14–54)
AST: 13 U/L — ABNORMAL LOW (ref 15–41)
Albumin: 3.4 g/dL — ABNORMAL LOW (ref 3.5–5.0)
Alkaline Phosphatase: 60 U/L (ref 38–126)
Anion gap: 10 (ref 5–15)
BUN: 12 mg/dL (ref 6–20)
CHLORIDE: 106 mmol/L (ref 101–111)
CO2: 27 mmol/L (ref 22–32)
CREATININE: 0.88 mg/dL (ref 0.44–1.00)
Calcium: 9.1 mg/dL (ref 8.9–10.3)
GFR calc non Af Amer: 58 mL/min — ABNORMAL LOW (ref >60)
Glucose, Bld: 111 mg/dL — ABNORMAL HIGH (ref 65–99)
POTASSIUM: 3.2 mmol/L — AB (ref 3.5–5.1)
SODIUM: 143 mmol/L (ref 135–145)
Total Bilirubin: 0.2 mg/dL — ABNORMAL LOW (ref 0.3–1.2)
Total Protein: 7.4 g/dL (ref 6.5–8.1)

## 2015-11-23 LAB — TROPONIN I
Troponin I: 0.06 ng/mL — ABNORMAL HIGH (ref <0.031)
Troponin I: 0.07 ng/mL — ABNORMAL HIGH (ref <0.031)
Troponin I: 0.06 ng/mL — ABNORMAL HIGH (ref <0.031)

## 2015-11-23 LAB — TSH: TSH: 2.671 u[IU]/mL (ref 0.350–4.500)

## 2015-11-23 LAB — CK: Total CK: 18 U/L — ABNORMAL LOW (ref 38–234)

## 2015-11-23 MED ORDER — PANTOPRAZOLE SODIUM 40 MG PO TBEC
40.0000 mg | DELAYED_RELEASE_TABLET | Freq: Every day | ORAL | Status: DC
  Administered 2015-11-24: 40 mg via ORAL
  Filled 2015-11-23: qty 1

## 2015-11-23 MED ORDER — QUETIAPINE FUMARATE 25 MG PO TABS: 200.0000 mg | ORAL_TABLET | Freq: Two times a day (BID) | ORAL | Status: DC | PRN

## 2015-11-23 MED ORDER — DONEPEZIL HCL 5 MG PO TABS
10.0000 mg | ORAL_TABLET | Freq: Every day | ORAL | Status: DC
  Administered 2015-11-23: 10 mg via ORAL
  Filled 2015-11-23: qty 2

## 2015-11-23 MED ORDER — ONDANSETRON HCL 4 MG/2ML IJ SOLN: 4.0000 mg | Freq: Four times a day (QID) | INTRAMUSCULAR | Status: DC | PRN

## 2015-11-23 MED ORDER — POTASSIUM CHLORIDE CRYS ER 20 MEQ PO TBCR
40.0000 meq | EXTENDED_RELEASE_TABLET | Freq: Once | ORAL | Status: DC
  Filled 2015-11-23: qty 2

## 2015-11-23 MED ORDER — ONDANSETRON HCL 4 MG PO TABS: 4.0000 mg | ORAL_TABLET | Freq: Four times a day (QID) | ORAL | Status: DC | PRN

## 2015-11-23 MED ORDER — ENSURE ENLIVE PO LIQD
237.0000 mL | Freq: Three times a day (TID) | ORAL | Status: DC
  Administered 2015-11-24: 237 mL via ORAL

## 2015-11-23 MED ORDER — PRAVASTATIN SODIUM 20 MG PO TABS
40.0000 mg | ORAL_TABLET | Freq: Every day | ORAL | Status: DC
  Administered 2015-11-23: 40 mg via ORAL
  Filled 2015-11-23: qty 2

## 2015-11-23 MED ORDER — SERTRALINE HCL 50 MG PO TABS
100.0000 mg | ORAL_TABLET | Freq: Every day | ORAL | Status: DC
  Administered 2015-11-23 – 2015-11-24 (×2): 100 mg via ORAL
  Filled 2015-11-23 (×2): qty 2

## 2015-11-23 MED ORDER — ACETAMINOPHEN 325 MG PO TABS: 650.0000 mg | ORAL_TABLET | Freq: Four times a day (QID) | ORAL | Status: DC | PRN

## 2015-11-23 MED ORDER — ASPIRIN EC 81 MG PO TBEC
81.0000 mg | DELAYED_RELEASE_TABLET | Freq: Every day | ORAL | Status: DC
  Administered 2015-11-23 – 2015-11-24 (×2): 81 mg via ORAL
  Filled 2015-11-23 (×2): qty 1

## 2015-11-23 MED ORDER — TRAZODONE HCL 50 MG PO TABS
50.0000 mg | ORAL_TABLET | Freq: Every day | ORAL | Status: DC
  Administered 2015-11-23: 50 mg via ORAL
  Filled 2015-11-23: qty 1

## 2015-11-23 MED ORDER — FLUTICASONE PROPIONATE 50 MCG/ACT NA SUSP
1.0000 | Freq: Every day | NASAL | Status: DC
  Administered 2015-11-23 – 2015-11-24 (×2): 1 via NASAL
  Filled 2015-11-23: qty 16

## 2015-11-23 MED ORDER — ACETAMINOPHEN 650 MG RE SUPP: 650.0000 mg | Freq: Four times a day (QID) | RECTAL | Status: DC | PRN

## 2015-11-23 MED ORDER — ZINC OXIDE 20 % EX OINT
1.0000 "application " | TOPICAL_OINTMENT | CUTANEOUS | Status: DC | PRN
  Filled 2015-11-23: qty 28.35

## 2015-11-23 MED ORDER — POTASSIUM CHLORIDE IN NACL 20-0.9 MEQ/L-% IV SOLN
INTRAVENOUS | Status: DC
  Administered 2015-11-23 – 2015-11-24 (×2): via INTRAVENOUS
  Filled 2015-11-23 (×3): qty 1000

## 2015-11-23 MED ORDER — POTASSIUM CHLORIDE 20 MEQ PO PACK
20.0000 meq | PACK | Freq: Once | ORAL | Status: AC
  Administered 2015-11-23: 20 meq via ORAL
  Filled 2015-11-23: qty 1

## 2015-11-23 MED ORDER — LORATADINE 10 MG PO TABS
10.0000 mg | ORAL_TABLET | Freq: Every day | ORAL | Status: DC
  Administered 2015-11-23 – 2015-11-24 (×2): 10 mg via ORAL
  Filled 2015-11-23 (×2): qty 1

## 2015-11-23 MED ORDER — METOPROLOL TARTRATE 25 MG PO TABS
25.0000 mg | ORAL_TABLET | Freq: Two times a day (BID) | ORAL | Status: DC
  Administered 2015-11-24: 25 mg via ORAL
  Filled 2015-11-23 (×2): qty 1

## 2015-11-23 MED ORDER — MEMANTINE HCL ER 28 MG PO CP24
28.0000 mg | ORAL_CAPSULE | Freq: Every day | ORAL | Status: DC
  Administered 2015-11-24: 28 mg via ORAL
  Filled 2015-11-23 (×3): qty 1

## 2015-11-23 NOTE — H&P (Signed)
[place] at [place] NAME: [name]    MR#:  [id number]  DATE OF BIRTH:  07-Jun-1928  DATE OF ADMISSION:  11/23/2015  PRIMARY CARE PHYSICIAN: Mathews Argyle, MD   REQUESTING/REFERRING PHYSICIAN: Meade Maw MD  CHIEF COMPLAINT:   Chief Complaint  Patient presents with  . Fall    HISTORY OF PRESENT ILLNESS: [name]  is a 81 y.o. female with a known history of dementia who was actually hospitalized here and discharged on December 9. At that time she was admitted with acute encephalopathy was noted to have acute CVA. Lower GI bleed and possible pneumonia. She was discharged to a memory care unit. Patient has had difficulty with ambulation and is supposed to use a walker but due to her dementia and unable to remember to use a walker. Who earlier today at the memory care unit fell down. She is brought to the emergency room. And her troponin was checked which was 0.06. EKG shows no changes. The ED physician requested an admission due to her troponin being normal before and now being 0.06. Patient also noticed to have hypokalemia. She denying any chest pain or shortness of breath.     PAST MEDICAL HISTORY:   Past Medical History  Diagnosis Date  . Cancer ([place])   . Heart murmur   . Breast cancer ([place])     h/o  . CVA (cerebral infarction)   . GI bleed   . PNA (pneumonia)   . Dementia     PAST SURGICAL HISTORY:  Past Surgical History  Procedure Laterality Date  . Mastectomy  Rt side    1998  . Bladder tack    . Rotator cuff repair Right     SOCIAL HISTORY:  Social History  Substance Use Topics  . Smoking status: Never Smoker   . Smokeless tobacco: Not on file  . Alcohol Use: No    FAMILY HISTORY:  Family History  Problem Relation Age of Onset  . Leukemia Sister   . CAD Father     DRUG ALLERGIES:  Allergies  Allergen Reactions  . Sulfa Antibiotics Hives    REVIEW OF SYSTEMS:   CONSTITUTIONAL:  Due to dementia unable to provide review of systems    MEDICATIONS AT HOME:  Prior to Admission medications   Medication Sig Start Date End Date Taking? Authorizing Provider  cetirizine (ZYRTEC) 10 MG tablet Take 10 mg by mouth daily as needed for allergies.   Yes Historical Provider, MD  donepezil (ARICEPT) 10 MG tablet Take 10 mg by mouth at bedtime.   Yes Historical Provider, MD  feeding supplement, ENSURE ENLIVE, (ENSURE ENLIVE) LIQD Take 237 mLs by mouth 3 (three) times daily with meals. 11/13/15  Yes Nicholes Mango, MD  fluticasone (FLONASE) 50 MCG/ACT nasal spray Place 1 spray into both nostrils daily.   Yes Historical Provider, MD  memantine (NAMENDA XR) 28 MG CP24 24 hr capsule Take 28 mg by mouth daily.   Yes Historical Provider, MD  Menthol-Zinc Oxide (RISAMINE) 0.44-20.625 % OINT Apply 1 application topically as needed (for irritation).    Yes Historical Provider, MD  metoprolol tartrate (LOPRESSOR) 25 MG tablet Take 25 mg by mouth 2 (two) times daily.   Yes Historical Provider, MD  pantoprazole (PROTONIX) 40 MG tablet Take 1 tablet (40 mg total) by mouth daily. 11/13/15  Yes Nicholes Mango, MD  pravastatin (PRAVACHOL) 40 MG tablet Take 1 tablet (40 mg total) by mouth daily at  6 PM. 11/13/15  Yes Nicholes Mango, MD  QUEtiapine (SEROQUEL) 200 MG tablet Take 200 mg by mouth 2 (two) times daily as needed (for agitation).   Yes Historical Provider, MD  sertraline (ZOLOFT) 100 MG tablet Take 100 mg by mouth daily.   Yes Historical Provider, MD  traZODone (DESYREL) 50 MG tablet Take 50 mg by mouth at bedtime.   Yes Historical Provider, MD  zinc oxide 20 % ointment Apply topically as needed for irritation. 11/13/15  Yes Nicholes Mango, MD  aspirin EC 81 MG EC tablet Take 1 tablet (81 mg total) by mouth daily. Patient not taking: Reported on 11/23/2015 11/13/15   Nicholes Mango, MD  docusate sodium (COLACE) 100 MG capsule Take 1 capsule (100 mg total) by mouth 2 (two) times daily. Patient not taking: Reported  on 11/23/2015 11/13/15   Nicholes Mango, MD  levofloxacin (LEVAQUIN) 750 MG tablet Take 1 tablet (750 mg total) by mouth daily. Patient not taking: Reported on 11/23/2015 11/13/15   Nicholes Mango, MD      PHYSICAL EXAMINATION:   VITAL SIGNS: Blood pressure 119/66, pulse 78, temperature 98.5 F (36.9 C), temperature source Oral, height 5\' 6"  (1.676 m), weight 66 kg (145 lb 8.1 oz), SpO2 96 %.  GENERAL:  81 y.o.-year-old patient lying in the bed with no acute distress.  EYES: Pupils equal, round, reactive to light and accommodation. No scleral icterus. Extraocular muscles intact.  HEENT: Head atraumatic, normocephalic. Oropharynx and nasopharynx clear.  NECK:  Supple, no jugular venous distention. No thyroid enlargement, no tenderness.  LUNGS: Normal breath sounds bilaterally, no wheezing, rales,rhonchi or crepitation. No use of accessory muscles of respiration.  CARDIOVASCULAR: S1, S2 normal. No murmurs, rubs, or gallops.  ABDOMEN: Soft, nontender, nondistended. Bowel sounds present. No organomegaly or mass.  EXTREMITIES: No pedal edema, cyanosis, or clubbing.  NEUROLOGIC: Cranial nerves II through XII are intact. Muscle strength 5/5 in all extremities. Sensation intact. Gait not checked.  PSYCHIATRIC: The patient is alert and oriented x 3.  SKIN: No obvious rash, lesion, or ulcer.   LABORATORY PANEL:   CBC  Recent Labs Lab 11/23/15 1200  WBC 9.4  HGB 9.2*  HCT 28.9*  PLT 232  MCV 84.2  MCH 26.9  MCHC 32.0  RDW 13.5  LYMPHSABS 2.1  MONOABS 1.7*  EOSABS 0.0  BASOSABS 0.0   ------------------------------------------------------------------------------------------------------------------  Chemistries   Recent Labs Lab 11/23/15 1119  NA 143  K 3.2*  CL 106  CO2 27  GLUCOSE 111*  BUN 12  CREATININE 0.88  CALCIUM 9.1  AST 13*  ALT 7*  ALKPHOS 60  BILITOT 0.2*    ------------------------------------------------------------------------------------------------------------------ estimated creatinine clearance is 43.8 mL/min (by C-G formula based on Cr of 0.88). ------------------------------------------------------------------------------------------------------------------ No results for input(s): TSH, T4TOTAL, T3FREE, THYROIDAB in the last 72 hours.  Invalid input(s): FREET3   Coagulation profile No results for input(s): INR, PROTIME in the last 168 hours. ------------------------------------------------------------------------------------------------------------------- No results for input(s): DDIMER in the last 72 hours. -------------------------------------------------------------------------------------------------------------------  Cardiac Enzymes  Recent Labs Lab 11/23/15 1119  TROPONINI 0.06*   ------------------------------------------------------------------------------------------------------------------ Invalid input(s): POCBNP  ---------------------------------------------------------------------------------------------------------------  Urinalysis    Component Value Date/Time   COLORURINE YELLOW* 11/06/2015 2151   COLORURINE Yellow 03/19/2015 2008   APPEARANCEUR CLEAR* 11/06/2015 2151   APPEARANCEUR Cloudy 03/19/2015 2008   LABSPEC 1.021 11/06/2015 2151   LABSPEC 1.023 03/19/2015 2008   PHURINE 5.0 11/06/2015 2151   PHURINE 5.0 03/19/2015 2008   GLUCOSEU NEGATIVE 11/06/2015 2151   GLUCOSEU Negative 03/19/2015 2008  HGBUR NEGATIVE 11/06/2015 2151   HGBUR 2+ 03/19/2015 2008   [place] NEGATIVE 11/06/2015 2151   BILIRUBINUR Negative 03/19/2015 2008   [place] NEGATIVE 11/06/2015 2151   [place] Negative 03/19/2015 2008   PROTEINUR NEGATIVE 11/06/2015 2151   PROTEINUR 100 mg/dL 03/19/2015 2008   NITRITE NEGATIVE 11/06/2015 2151   NITRITE Positive 03/19/2015 2008   LEUKOCYTESUR 1+* 11/06/2015 2151    LEUKOCYTESUR 3+ 03/19/2015 2008     RADIOLOGY: No results found.  EKG: Orders placed or performed during the hospital encounter of 11/23/15  . EKG 12-Lead  . EKG 12-Lead    IMPRESSION AND PLAN: Patient is a 81 year old white female being admitted after fall  1. Status post fall likely due to imbalance its use and patient not using a walker. 2. Elevated troponin I do not feel that this is significant. I will check a CPK to make sure with her fall did not have rhabdo. We'll cycle her troponins. 3. Hypokalemia we'll give her IV fluids follow potassium in the morning 4. Dementia continue Namenda  5. Hyperlipidemia continue pravastatin 6. Depression and anxiety continue Zoloft and Seroquel and trazodone 7. History of CVA recently resume aspirin no evidence of GI bleed currently 8. Miscellaneous SCDs for DVT prophylaxis     All the records are reviewed and case discussed with ED provider. Management plans discussed with the patient, family and they are in agreement.  CODE STATUS: DO NOT RESUSCITATE Advance Directive Documentation        Most Recent Value   Type of Advance Directive  Out of facility DNR (pink MOST or yellow form)   Pre-existing out of facility DNR order (yellow form or pink MOST form)  Yellow form placed in chart (order not valid for inpatient use)   "MOST" Form in Place?         TOTAL TIME TAKING CARE OF THIS PATIENT: 50 minutes.    Dustin Flock M.D on 11/23/2015 at 1:01 PM  Between 7am to 6pm - Pager - [phone]  After 6pm go to [email or web address] - password [email or web address] [facility]  [place] [facility]  Office  [phone]  CC: Primary care physician; Mathews Argyle, MD

## 2015-11-23 NOTE — ED Notes (Signed)
Called report, RN stated she was going to call her supervisor to verify if pt was appropriate for floor

## 2015-11-23 NOTE — Care Management Note (Signed)
Case Management Note  Patient Details  Name: [name] MRN: [id number] Date of Birth: May 06, 1928  Subjective/Objective:         Admitted from [facility] Social Work Scientific laboratory technician for placement.           Action/Plan:   Expected Discharge Date:                  Expected Discharge Plan:     In-House Referral:     Discharge planning Services     Post Acute Care Choice:    Choice offered to:     DME Arranged:    DME Agency:     HH Arranged:    [place] Agency:     Status of Service:     Medicare Important Message Given:    Date Medicare IM Given:    Medicare IM give by:    Date Additional Medicare IM Given:    Additional Medicare Important Message give by:     If discussed at [place] of Stay Meetings, dates discussed:    Additional Comments:  Dacoda Finlay A, RN 11/23/2015, 5:13 PM

## 2015-11-23 NOTE — ED Notes (Signed)
Attempted to call report, floor not ready.

## 2015-11-23 NOTE — ED Notes (Signed)
Pt family requesting to speak with MD before discharge.  MD at bedside.

## 2015-11-23 NOTE — ED Notes (Signed)
Called [facility], talked to jasmine. She said send pt back by ems

## 2015-11-23 NOTE — ED Notes (Signed)
Ems from [facility] for unwitnessed fall. Staff concerned pt hit head, pt denies pain or injury.

## 2015-11-23 NOTE — Discharge Instructions (Signed)

## 2015-11-23 NOTE — Progress Notes (Signed)
Patient accepted from ED to room 141.  Upon arrival patient confused and agitated with daughters present x 2 that verbalized patient required sitter for last admission 2 weeks ago. Patient moved to room 152 when cleaned.  Daughters unable to stay with patient.  Patient pulled left AC IV out and nurse unable to reorient patient to stay in bed or chair safely due to history of dementia.  Charge nurse and primary MD notified.  Order obtained for [employer] crew.

## 2015-11-23 NOTE — ED Provider Notes (Addendum)
Time Seen: Approximately *0 940  I have reviewed the triage notes  Chief Complaint: Fall   History of Present Illness: SARIN VERRETT is a 81 y.o. female who presents with a transfer from [facility] for evaluation of a fall. Patient had a questionable head injury. Patient denies any pain in any location. She does have some mild dementia but is able to answer questions appropriately. She herself is not aware of any head injury. There is no reports for altered mental status per the nursing facility.   Past Medical History  Diagnosis Date  . Cancer ([place])   . Heart murmur   . Breast cancer [facility])     h/o    Patient Active Problem List   Diagnosis Date Noted  . CVA (cerebral infarction) 11/08/2015  . Encephalopathy 11/07/2015    Past Surgical History  Procedure Laterality Date  . Mastectomy  Rt side    1998  . Bladder tack    . Rotator cuff repair Right     Past Surgical History  Procedure Laterality Date  . Mastectomy  Rt side    1998  . Bladder tack    . Rotator cuff repair Right     Current Outpatient Rx  Name  Route  Sig  Dispense  Refill  . cetirizine (ZYRTEC) 10 MG tablet   Oral   Take 10 mg by mouth daily as needed for allergies.         Marland Kitchen donepezil (ARICEPT) 10 MG tablet   Oral   Take 10 mg by mouth at bedtime.         . feeding supplement, ENSURE ENLIVE, (ENSURE ENLIVE) LIQD   Oral   Take 237 mLs by mouth 3 (three) times daily with meals.   237 mL   12   . fluticasone (FLONASE) 50 MCG/ACT nasal spray   Each Nare   Place 1 spray into both nostrils daily.         . memantine (NAMENDA XR) 28 MG CP24 24 hr capsule   Oral   Take 28 mg by mouth daily.         . Menthol-Zinc Oxide (RISAMINE) 0.44-20.625 % OINT   Apply externally   Apply 1 application topically as needed (for irritation).          . metoprolol tartrate (LOPRESSOR) 25 MG tablet   Oral   Take 25 mg by mouth 2 (two) times daily.         . pantoprazole (PROTONIX) 40 MG  tablet   Oral   Take 1 tablet (40 mg total) by mouth daily.   30 tablet   0   . pravastatin (PRAVACHOL) 40 MG tablet   Oral   Take 1 tablet (40 mg total) by mouth daily at 6 PM.   30 tablet   0   . QUEtiapine (SEROQUEL) 200 MG tablet   Oral   Take 200 mg by mouth 2 (two) times daily as needed (for agitation).         . sertraline (ZOLOFT) 100 MG tablet   Oral   Take 100 mg by mouth daily.         . traZODone (DESYREL) 50 MG tablet   Oral   Take 50 mg by mouth at bedtime.         Marland Kitchen zinc oxide 20 % ointment   Topical   Apply topically as needed for irritation.   56.7 g   0   .  aspirin EC 81 MG EC tablet   Oral   Take 1 tablet (81 mg total) by mouth daily. Patient not taking: Reported on 11/23/2015   30 tablet   0   . docusate sodium (COLACE) 100 MG capsule   Oral   Take 1 capsule (100 mg total) by mouth 2 (two) times daily. Patient not taking: Reported on 11/23/2015   10 capsule   0   . levofloxacin (LEVAQUIN) 750 MG tablet   Oral   Take 1 tablet (750 mg total) by mouth daily. Patient not taking: Reported on 11/23/2015   5 tablet   0     Allergies:  Sulfa antibiotics  Family History: Family History  Problem Relation Age of Onset  . Leukemia Sister   . CAD Father     Social History: Social History  Substance Use Topics  . Smoking status: Never Smoker   . Smokeless tobacco: None  . Alcohol Use: No     Review of Systems:   10 point review of systems was performed and was otherwise negative: Taken from the medical record Constitutional: No fever Eyes: No visual disturbances ENT: No sore throat, ear pain Cardiac: No chest pain Respiratory: No shortness of breath, wheezing, or stridor Abdomen: No abdominal pain, no vomiting, No diarrhea Endocrine: No weight loss, No night sweats Extremities: No peripheral edema, cyanosis Skin: No rashes, easy bruising Neurologic: No focal weakness, trouble with speech or swollowing Urologic: No  dysuria, Hematuria, or urinary frequency   Physical Exam:  ED Triage Vitals  Enc Vitals Group     BP 11/23/15 0939 97/75 mmHg     Pulse Rate 11/23/15 0939 77     Resp --      Temp 11/23/15 0939 98.5 F (36.9 C)     Temp Source 11/23/15 0939 Oral     SpO2 11/23/15 0939 96 %     Weight 11/23/15 0939 145 lb 8.1 oz (66 kg)     Height 11/23/15 0939 5\' 6"  (1.676 m)     Head Cir --      Peak Flow --      Pain Score 11/23/15 0941 0     Pain Loc --      Pain Edu? --      Excl. in [place]? --     General: Awake , Alert , and Oriented times 2; GCS 15 Head: Normal cephalic , atraumatic Eyes: Pupils equal , round, reactive to light Nose/Throat: No nasal drainage, patent upper airway without erythema or exudate.  Neck: Supple, Full range of motion, No anterior adenopathy or palpable thyroid masses Lungs: Clear to ascultation without wheezes , rhonchi, or rales Heart: Regular rate, regular rhythm without murmurs , gallops , or rubs Abdomen: Soft, non tender without rebound, guarding , or rigidity; bowel sounds positive and symmetric in all 4 quadrants. No organomegaly .        Extremities: 2 plus symmetric pulses. No edema, clubbing or cyanosis. Full range of motion without any focal discomfort or abnormalities Neurologic: normal ambulation, Motor symmetric without deficits, sensory intact Skin: warm, dry, no rashes No reproducible cervical, thoracic, or lumbar spine pain  EKG: ED ECG REPORT I, Daymon Larsen, the attending physician, personally viewed and interpreted this ECG.  Date: 11/23/2015 EKG Time: 0 942 Rate: *79 Rhythm: normal sinus rhythm QRS Axis: normal Intervals: normal ST/T Wave abnormalities: normal Conduction Disutrbances: none Narrative Interpretation: unremarkable     ED Course: Patient's stay here was uneventful and cannot  find any acute injuries from her fall. There did not appear to be any obvious loss of consciousness according to the record. Patient's had  her baseline mental status and I can't not find any evidence of head trauma. She is not currently on any significant anticoagulation therapy and I felt imaging was not necessary at this time.  Patient was initially scheduled for discharge in speaking to the family they state that she's had a decreased appetite and has intermittently complained of nausea and some generalized weakness. Patient then had laboratory work performed and shows an increase in her troponin 2.06. Review of her previous records show a troponin normally sits up 0.03. I'm not sure of the clinical significance of this elevated troponin at this time given an 81 year old [facility] DO NOT RESUSCITATE patient however it is a change from her previous troponin levels. Her EKG does not show any ischemic changes. I spoke to the hospitalist team, they've agreed to see and evaluate the patient determined inpatient versus outpatient management  Assessment: Status post fall with no obvious acute injury*  Elevated troponin of unknown significance   Plan:  Inpatient management           Daymon Larsen, MD 11/23/15 [id number]  Daymon Larsen, MD 11/23/15 1246

## 2015-11-24 ENCOUNTER — Observation Stay: Payer: Medicare Other

## 2015-11-24 LAB — CBC
HEMATOCRIT: 26.4 % — AB (ref 35.0–47.0)
HEMOGLOBIN: 8.5 g/dL — AB (ref 12.0–16.0)
MCH: 27.5 pg (ref 26.0–34.0)
MCHC: 32.4 g/dL (ref 32.0–36.0)
MCV: 85 fL (ref 80.0–100.0)
Platelets: 209 10*3/uL (ref 150–440)
RBC: 3.1 MIL/uL — AB (ref 3.80–5.20)
RDW: 13.6 % (ref 11.5–14.5)
WBC: 8.8 10*3/uL (ref 3.6–11.0)

## 2015-11-24 LAB — BASIC METABOLIC PANEL
Anion gap: 5 (ref 5–15)
BUN: 11 mg/dL (ref 6–20)
CHLORIDE: 110 mmol/L (ref 101–111)
CO2: 26 mmol/L (ref 22–32)
CREATININE: 0.73 mg/dL (ref 0.44–1.00)
Calcium: 8.4 mg/dL — ABNORMAL LOW (ref 8.9–10.3)
GFR calc Af Amer: >60 mL/min (ref >60)
GFR calc non Af Amer: >60 mL/min (ref >60)
Glucose, Bld: 98 mg/dL (ref 65–99)
POTASSIUM: 3.9 mmol/L (ref 3.5–5.1)
Sodium: 141 mmol/L (ref 135–145)

## 2015-11-24 NOTE — Care Management (Signed)
Per MD patient is medically ready for discharge.  Patient from [facility].  [place] notified

## 2015-11-24 NOTE — Discharge Summary (Signed)
[place] [facility] [place] NAME: [name]    MR#:  [id number]  DATE OF BIRTH:  Jun 03, 1928  DATE OF ADMISSION:  11/23/2015 ADMITTING PHYSICIAN: Dustin Flock, MD  DATE OF DISCHARGE: 11/24/2015 11:28 AM  PRIMARY CARE PHYSICIAN: Mathews Argyle, MD    ADMISSION DIAGNOSIS:  SOB (shortness of breath) [R06.02] Fall, initial encounter [W19.XXXA]  DISCHARGE DIAGNOSIS:  Active Problems:   Fall   SECONDARY DIAGNOSIS:   Past Medical History  Diagnosis Date  . Cancer ([place])   . Heart murmur   . Breast cancer ([place])     h/o  . CVA (cerebral infarction)   . GI bleed   . PNA (pneumonia)   . Dementia     HOSPITAL COURSE:   81 year old female with past medical history of dementia, breast cancer, history of previous CVA, history of GI bleed, history of recent pneumonia who presented to the hospital after a fall and noted to have a mildly elevated troponin.  #1 elevated troponin-this was likely secondary to a traumatic fall without any evidence of acute coronary syndrome. -She was observed on telemetry and did not have any further elevation of her troponin. She was chest pain-free without evidence of arrhythmia. She was therefore discharged back to group home. -We will continue aspirin, beta blocker, statin.  #2 dementia-she will continue on Namenda, Aricept, Seroquel.  #3 GERD-she will continue Protonix.  #4 hyperlipidemia-she will continue Pravachol.   DISCHARGE CONDITIONS:   Stable  CONSULTS OBTAINED:     DRUG ALLERGIES:   Allergies  Allergen Reactions  . Sulfa Antibiotics Hives    DISCHARGE MEDICATIONS:   Discharge Medication List as of 11/24/2015 10:57 AM    CONTINUE these medications which have NOT CHANGED   Details  cetirizine (ZYRTEC) 10 MG tablet Take 10 mg by mouth daily as needed for allergies., Until Discontinued, Historical Med    donepezil (ARICEPT) 10 MG tablet Take 10 mg by mouth at bedtime.,  Until Discontinued, Historical Med    feeding supplement, ENSURE ENLIVE, (ENSURE ENLIVE) LIQD Take 237 mLs by mouth 3 (three) times daily with meals., Starting 11/13/2015, Until Discontinued, Normal    fluticasone (FLONASE) 50 MCG/ACT nasal spray Place 1 spray into both nostrils daily., Until Discontinued, Historical Med    memantine (NAMENDA XR) 28 MG CP24 24 hr capsule Take 28 mg by mouth daily., Until Discontinued, Historical Med    Menthol-Zinc Oxide (RISAMINE) 0.44-20.625 % OINT Apply 1 application topically as needed (for irritation). , Until Discontinued, Historical Med    metoprolol tartrate (LOPRESSOR) 25 MG tablet Take 25 mg by mouth 2 (two) times daily., Until Discontinued, Historical Med    pantoprazole (PROTONIX) 40 MG tablet Take 1 tablet (40 mg total) by mouth daily., Starting 11/13/2015, Until Discontinued, Normal    pravastatin (PRAVACHOL) 40 MG tablet Take 1 tablet (40 mg total) by mouth daily at 6 PM., Starting 11/13/2015, Until Discontinued, Normal    QUEtiapine (SEROQUEL) 200 MG tablet Take 200 mg by mouth 2 (two) times daily as needed (for agitation)., Until Discontinued, Historical Med    sertraline (ZOLOFT) 100 MG tablet Take 100 mg by mouth daily., Until Discontinued, Historical Med    traZODone (DESYREL) 50 MG tablet Take 50 mg by mouth at bedtime., Until Discontinued, Historical Med    zinc oxide 20 % ointment Apply topically as needed for irritation., Starting 11/13/2015, Until Discontinued, Normal    aspirin EC 81 MG EC tablet Take 1 tablet (81 mg total)  by mouth daily., Starting 11/13/2015, Until Discontinued, Print    docusate sodium (COLACE) 100 MG capsule Take 1 capsule (100 mg total) by mouth 2 (two) times daily., Starting 11/13/2015, Until Discontinued, Normal      STOP taking these medications     levofloxacin (LEVAQUIN) 750 MG tablet          DISCHARGE INSTRUCTIONS:   DIET:  Cardiac diet  DISCHARGE CONDITION:  Stable  ACTIVITY:  Activity as  tolerated  OXYGEN:  Home Oxygen: No.   Oxygen Delivery: room air  DISCHARGE LOCATION:  group home   If you experience worsening of your admission symptoms, develop shortness of breath, life threatening emergency, suicidal or homicidal thoughts you must seek medical attention immediately by calling 911 or calling your MD immediately  if symptoms less severe.  You Must read complete instructions/literature along with all the possible adverse reactions/side effects for all the Medicines you take and that have been prescribed to you. Take any new Medicines after you have completely understood and accpet all the possible adverse reactions/side effects.   Please note  You were cared for by a hospitalist during your hospital stay. If you have any questions about your discharge medications or the care you received while you were in the hospital after you are discharged, you can call the unit and asked to speak with the hospitalist on call if the hospitalist that took care of you is not available. Once you are discharged, your primary care physician will handle any further medical issues. Please note that NO REFILLS for any discharge medications will be authorized once you are discharged, as it is imperative that you return to your primary care physician (or establish a relationship with a primary care physician if you do not have one) for your aftercare needs so that they can reassess your need for medications and monitor your lab values.     Today   No chest pains presently. No evidence of acute arrhythmia overnight.  VITAL SIGNS:  Blood pressure 140/58, pulse 96, temperature 97.9 F (36.6 C), temperature source Oral, resp. rate 16, height 5\' 6"  (1.676 m), weight 66 kg (145 lb 8.1 oz), SpO2 100 %.  I/O:   Intake/Output Summary (Last 24 hours) at 11/24/15 1658 Last data filed at 11/24/15 0900  Gross per 24 hour  Intake    240 ml  Output      0 ml  Net    240 ml    PHYSICAL EXAMINATION:   GENERAL:  81 y.o.-year-old demented patient lying in the bed with no acute distress.  EYES: Pupils equal, round, reactive to light and accommodation. No scleral icterus. Extraocular muscles intact.  HEENT: Head atraumatic, normocephalic. Oropharynx and nasopharynx clear.  NECK:  Supple, no jugular venous distention. No thyroid enlargement, no tenderness.  LUNGS: Normal breath sounds bilaterally, no wheezing, rales,rhonchi. No use of accessory muscles of respiration.  CARDIOVASCULAR: S1, S2 normal. No murmurs, rubs, or gallops.  ABDOMEN: Soft, non-tender, non-distended. Bowel sounds present. No organomegaly or mass.  EXTREMITIES: No pedal edema, cyanosis, or clubbing.  NEUROLOGIC: Cranial nerves II through XII are intact. No focal motor or sensory defecits b/l.  PSYCHIATRIC: The patient is alert and oriented x 1. Good affect.  SKIN: No obvious rash, lesion, or ulcer.   DATA REVIEW:   CBC  Recent Labs Lab 11/24/15 0532  WBC 8.8  HGB 8.5*  HCT 26.4*  PLT 209    Chemistries   Recent Labs Lab 11/23/15 1119 11/24/15 0532  NA 143 141  K 3.2* 3.9  CL 106 110  CO2 27 26  GLUCOSE 111* 98  BUN 12 11  CREATININE 0.88 0.73  CALCIUM 9.1 8.4*  AST 13*  --   ALT 7*  --   ALKPHOS 60  --   BILITOT 0.2*  --     Cardiac Enzymes  Recent Labs Lab 11/23/15 1715  TROPONINI 0.06*      RADIOLOGY:  Dg Chest 1 View  11/24/2015  CLINICAL DATA:  Shortness of breath, recent fall, CVA. EXAM: CHEST 1 VIEW COMPARISON:  PA and lateral chest x-ray of November 12, 2015 FINDINGS: The lungs are adequately inflated. There is no focal infiltrate. The interstitial markings are mildly increased bilaterally but this is stable. The heart and pulmonary vascularity are normal. The mediastinum is normal in width. There is tortuosity of the descending thoracic aorta. IMPRESSION: Chronic stable increased pulmonary interstitial markings. There is no evidence of pneumonia, CHF, nor other acute cardiopulmonary  abnormality. Electronically Signed   By: David  Martinique M.D.   On: 11/24/2015 07:38      Management plans discussed with the patient, family and they are in agreement.  CODE STATUS:  Advance Directive Documentation        Most Recent Value   Type of Advance Directive  Healthcare Power of Attorney, Living will   Pre-existing out of facility DNR order (yellow form or pink MOST form)  Yellow form placed in chart (order not valid for inpatient use)   "MOST" Form in Place?        TOTAL TIME TAKING CARE OF THIS PATIENT: 40 minutes.    Henreitta Leber M.D on 11/24/2015 at 4:58 PM  Between 7am to 6pm - Pager - [phone]  After 6pm go to [email or web address] - password [email or web address] [facility]  [facility]  Office  [phone]  CC: Primary care physician; Mathews Argyle, MD

## 2015-11-24 NOTE — Clinical Social Work Note (Signed)
Clinical Social Work Assessment  Patient Details  Name: [name] MRN: [id number] Date of Birth: 09/28/1928  Date of referral:  11/24/15               Reason for consult:  Facility Placement                Permission sought to share information with:  Facility Sport and exercise psychologist, Case Manager, Family Supports Permission granted to share information::  Yes, Verbal Permission Granted  Name::     [name]  Agency::     Relationship::     Contact Information:     Housing/Transportation Living arrangements for the past 2 months:  [place] of Information:  Facility, Adult Children Patient Interpreter Needed:  None Criminal Activity/Legal Involvement Pertinent to Current Situation/Hospitalization:    Significant Relationships:  Adult Children Lives with:  Facility Resident Do you feel safe going back to the place where you live?  Yes Need for family participation in patient care:  Yes (Comment)  Care giving concerns: Pt is from [facility] unit   Social Worker assessment / plan:  CSW was referred to Pt on day of dc. Pt was admitted for observation after a fall at her ALF. Pt has been cleared to return to [facility]. Pt's daughter is aware and agreeable to return. CSW contacted [facility] and they will provide transportation at dc. Per Md, no changes to medications or new recommendations.   Employment status:  Retired Nurse, adult PT Recommendations:  Not assessed at this time Information / Referral to community resources:     Patient/Family's Response to care:  Pt not oriented to situation or place, though says she is and is easily agitated when pressed.   Patient/Family's Understanding of and Emotional Response to Diagnosis, Current Treatment, and Prognosis:  Pt unaware of situation secondary to dementia. Pt's daughter supportive and understanding of Pt's illness. Stated that she had "no questions if the  doctor didn't find anything new."   Emotional Assessment Appearance:  Appears stated age Attitude/Demeanor/Rapport:  Hostile Affect (typically observed):  Restless, Agitated Orientation:  Oriented to Self Alcohol / Substance use:  Never Used Psych involvement (Current and /or in the community):  Outpatient Provider  Discharge Needs  Concerns to be addressed:  Care Coordination Readmission within the last 30 days:  Yes Current discharge risk:  Cognitively Impaired, Chronically ill Barriers to Discharge:  No Barriers Identified   Alonna Buckler, LCSW 11/24/2015, 11:42 AM

## 2015-12-12 ENCOUNTER — Inpatient Hospital Stay
Admission: EM | Admit: 2015-12-12 | Discharge: 2015-12-17 | DRG: 377 | Payer: Medicare Other | Attending: Internal Medicine | Admitting: Internal Medicine

## 2015-12-12 ENCOUNTER — Emergency Department: Payer: Medicare Other

## 2015-12-12 ENCOUNTER — Encounter: Payer: Self-pay | Admitting: Radiology

## 2015-12-12 DIAGNOSIS — Z8673 Personal history of transient ischemic attack (TIA), and cerebral infarction without residual deficits: Secondary | ICD-10-CM | POA: Diagnosis not present

## 2015-12-12 DIAGNOSIS — Z66 Do not resuscitate: Secondary | ICD-10-CM | POA: Diagnosis present

## 2015-12-12 DIAGNOSIS — Z853 Personal history of malignant neoplasm of breast: Secondary | ICD-10-CM

## 2015-12-12 DIAGNOSIS — J690 Pneumonitis due to inhalation of food and vomit: Secondary | ICD-10-CM | POA: Diagnosis present

## 2015-12-12 DIAGNOSIS — Z882 Allergy status to sulfonamides status: Secondary | ICD-10-CM

## 2015-12-12 DIAGNOSIS — Z7951 Long term (current) use of inhaled steroids: Secondary | ICD-10-CM | POA: Diagnosis not present

## 2015-12-12 DIAGNOSIS — Z8701 Personal history of pneumonia (recurrent): Secondary | ICD-10-CM

## 2015-12-12 DIAGNOSIS — Z806 Family history of leukemia: Secondary | ICD-10-CM | POA: Diagnosis not present

## 2015-12-12 DIAGNOSIS — I63512 Cerebral infarction due to unspecified occlusion or stenosis of left middle cerebral artery: Secondary | ICD-10-CM | POA: Diagnosis present

## 2015-12-12 DIAGNOSIS — R1111 Vomiting without nausea: Secondary | ICD-10-CM

## 2015-12-12 DIAGNOSIS — D62 Acute posthemorrhagic anemia: Secondary | ICD-10-CM | POA: Diagnosis present

## 2015-12-12 DIAGNOSIS — K922 Gastrointestinal hemorrhage, unspecified: Secondary | ICD-10-CM

## 2015-12-12 DIAGNOSIS — R1110 Vomiting, unspecified: Secondary | ICD-10-CM | POA: Insufficient documentation

## 2015-12-12 DIAGNOSIS — F0390 Unspecified dementia without behavioral disturbance: Secondary | ICD-10-CM | POA: Diagnosis present

## 2015-12-12 DIAGNOSIS — I639 Cerebral infarction, unspecified: Secondary | ICD-10-CM

## 2015-12-12 DIAGNOSIS — Z7982 Long term (current) use of aspirin: Secondary | ICD-10-CM | POA: Diagnosis not present

## 2015-12-12 DIAGNOSIS — Z8249 Family history of ischemic heart disease and other diseases of the circulatory system: Secondary | ICD-10-CM | POA: Diagnosis not present

## 2015-12-12 DIAGNOSIS — R531 Weakness: Secondary | ICD-10-CM | POA: Diagnosis present

## 2015-12-12 DIAGNOSIS — Z9010 Acquired absence of unspecified breast and nipple: Secondary | ICD-10-CM

## 2015-12-12 DIAGNOSIS — I63433 Cerebral infarction due to embolism of bilateral posterior cerebral arteries: Secondary | ICD-10-CM | POA: Diagnosis not present

## 2015-12-12 DIAGNOSIS — I10 Essential (primary) hypertension: Secondary | ICD-10-CM | POA: Diagnosis present

## 2015-12-12 DIAGNOSIS — Z9889 Other specified postprocedural states: Secondary | ICD-10-CM

## 2015-12-12 DIAGNOSIS — J189 Pneumonia, unspecified organism: Secondary | ICD-10-CM

## 2015-12-12 DIAGNOSIS — Z79899 Other long term (current) drug therapy: Secondary | ICD-10-CM

## 2015-12-12 DIAGNOSIS — K921 Melena: Principal | ICD-10-CM

## 2015-12-12 DIAGNOSIS — R011 Cardiac murmur, unspecified: Secondary | ICD-10-CM | POA: Diagnosis present

## 2015-12-12 LAB — URINALYSIS COMPLETE WITH MICROSCOPIC (ARMC ONLY)
Bacteria, UA: NONE SEEN
Bilirubin Urine: NEGATIVE
Glucose, UA: NEGATIVE mg/dL
Hgb urine dipstick: NEGATIVE
KETONES UR: NEGATIVE mg/dL
LEUKOCYTES UA: NEGATIVE
Nitrite: NEGATIVE
PROTEIN: NEGATIVE mg/dL
Specific Gravity, Urine: 1.018 (ref 1.005–1.030)
pH: 5 (ref 5.0–8.0)

## 2015-12-12 LAB — COMPREHENSIVE METABOLIC PANEL
ALK PHOS: 64 U/L (ref 38–126)
ALT: 9 U/L — AB (ref 14–54)
AST: 12 U/L — AB (ref 15–41)
Albumin: 3.7 g/dL (ref 3.5–5.0)
Anion gap: 9 (ref 5–15)
BILIRUBIN TOTAL: 0.3 mg/dL (ref 0.3–1.2)
BUN: 10 mg/dL (ref 6–20)
CALCIUM: 8.7 mg/dL — AB (ref 8.9–10.3)
CHLORIDE: 107 mmol/L (ref 101–111)
CO2: 23 mmol/L (ref 22–32)
CREATININE: 0.83 mg/dL (ref 0.44–1.00)
Glucose, Bld: 158 mg/dL — ABNORMAL HIGH (ref 65–99)
Potassium: 3.2 mmol/L — ABNORMAL LOW (ref 3.5–5.1)
Sodium: 139 mmol/L (ref 135–145)
Total Protein: 7.5 g/dL (ref 6.5–8.1)

## 2015-12-12 LAB — CBC WITH DIFFERENTIAL/PLATELET
BASOS ABS: 0 10*3/uL (ref 0–0.1)
Basophils Relative: 0 %
EOS PCT: 0 %
Eosinophils Absolute: 0 10*3/uL (ref 0–0.7)
HEMATOCRIT: 24.8 % — AB (ref 35.0–47.0)
HEMOGLOBIN: 7.9 g/dL — AB (ref 12.0–16.0)
LYMPHS ABS: 6.2 10*3/uL — AB (ref 1.0–3.6)
LYMPHS PCT: 69 %
MCH: 25 pg — AB (ref 26.0–34.0)
MCHC: 31.8 g/dL — ABNORMAL LOW (ref 32.0–36.0)
MCV: 78.7 fL — AB (ref 80.0–100.0)
MONOS PCT: 0 %
Monocytes Absolute: 0 10*3/uL — ABNORMAL LOW (ref 0.2–0.9)
NEUTROS PCT: 31 %
Neutro Abs: 2.8 10*3/uL (ref 1.4–6.5)
Platelets: 157 10*3/uL (ref 150–440)
RBC: 3.15 MIL/uL — AB (ref 3.80–5.20)
RDW: 15 % — ABNORMAL HIGH (ref 11.5–14.5)
WBC: 9 10*3/uL (ref 3.6–11.0)

## 2015-12-12 LAB — PREPARE RBC (CROSSMATCH)

## 2015-12-12 LAB — ABO/RH: ABO/RH(D): O NEG

## 2015-12-12 LAB — TROPONIN I: TROPONIN I: 0.05 ng/mL — AB (ref <0.031)

## 2015-12-12 LAB — MRSA PCR SCREENING: MRSA by PCR: POSITIVE — AB

## 2015-12-12 LAB — GLUCOSE, CAPILLARY: Glucose-Capillary: 148 mg/dL — ABNORMAL HIGH (ref 65–99)

## 2015-12-12 LAB — LIPASE, BLOOD: LIPASE: 74 U/L — AB (ref 11–51)

## 2015-12-12 MED ORDER — QUETIAPINE FUMARATE 25 MG PO TABS: 200.0000 mg | ORAL_TABLET | Freq: Two times a day (BID) | ORAL | Status: DC | PRN

## 2015-12-12 MED ORDER — METOPROLOL TARTRATE 25 MG PO TABS
25.0000 mg | ORAL_TABLET | Freq: Two times a day (BID) | ORAL | Status: DC
  Administered 2015-12-12 – 2015-12-17 (×10): 25 mg via ORAL
  Filled 2015-12-12 (×10): qty 1

## 2015-12-12 MED ORDER — TRAZODONE HCL 50 MG PO TABS
50.0000 mg | ORAL_TABLET | Freq: Every day | ORAL | Status: DC
  Administered 2015-12-12 – 2015-12-16 (×5): 50 mg via ORAL
  Filled 2015-12-12 (×5): qty 1

## 2015-12-12 MED ORDER — MORPHINE SULFATE (PF) 2 MG/ML IV SOLN: 2.0000 mg | INTRAVENOUS | Status: DC | PRN

## 2015-12-12 MED ORDER — ONDANSETRON HCL 4 MG PO TABS: 4.0000 mg | ORAL_TABLET | Freq: Four times a day (QID) | ORAL | Status: DC | PRN

## 2015-12-12 MED ORDER — FLUTICASONE PROPIONATE 50 MCG/ACT NA SUSP
1.0000 | Freq: Every day | NASAL | Status: DC
  Administered 2015-12-13 – 2015-12-17 (×5): 1 via NASAL
  Filled 2015-12-12: qty 16

## 2015-12-12 MED ORDER — POTASSIUM CHLORIDE IN NACL 40-0.9 MEQ/L-% IV SOLN
INTRAVENOUS | Status: DC
  Administered 2015-12-12 – 2015-12-15 (×8): 100 mL/h via INTRAVENOUS
  Filled 2015-12-12 (×10): qty 1000

## 2015-12-12 MED ORDER — MEMANTINE HCL ER 14 MG PO CP24
28.0000 mg | ORAL_CAPSULE | Freq: Every day | ORAL | Status: DC
  Administered 2015-12-13 – 2015-12-17 (×5): 28 mg via ORAL
  Filled 2015-12-12 (×5): qty 2

## 2015-12-12 MED ORDER — DOCUSATE SODIUM 100 MG PO CAPS
100.0000 mg | ORAL_CAPSULE | Freq: Two times a day (BID) | ORAL | Status: DC
  Administered 2015-12-12 – 2015-12-16 (×7): 100 mg via ORAL
  Filled 2015-12-12 (×8): qty 1

## 2015-12-12 MED ORDER — SERTRALINE HCL 100 MG PO TABS
100.0000 mg | ORAL_TABLET | Freq: Every day | ORAL | Status: DC
  Administered 2015-12-13 – 2015-12-17 (×5): 100 mg via ORAL
  Filled 2015-12-12 (×6): qty 1

## 2015-12-12 MED ORDER — SODIUM CHLORIDE 0.9 % IV SOLN: 10.0000 mL/h | Freq: Once | INTRAVENOUS | Status: DC

## 2015-12-12 MED ORDER — ACETAMINOPHEN 650 MG RE SUPP: 650.0000 mg | Freq: Four times a day (QID) | RECTAL | Status: DC | PRN

## 2015-12-12 MED ORDER — ONDANSETRON HCL 4 MG/2ML IJ SOLN
4.0000 mg | Freq: Once | INTRAMUSCULAR | Status: AC
  Administered 2015-12-12: 4 mg via INTRAVENOUS
  Filled 2015-12-12: qty 2

## 2015-12-12 MED ORDER — PIPERACILLIN-TAZOBACTAM 3.375 G IVPB
3.3750 g | Freq: Once | INTRAVENOUS | Status: AC
  Administered 2015-12-12: 3.375 g via INTRAVENOUS
  Filled 2015-12-12: qty 50

## 2015-12-12 MED ORDER — ZINC OXIDE 20 % EX OINT
TOPICAL_OINTMENT | CUTANEOUS | Status: DC | PRN
  Administered 2015-12-17 (×2): via TOPICAL
  Filled 2015-12-12: qty 28.35

## 2015-12-12 MED ORDER — ACETAMINOPHEN 325 MG PO TABS
650.0000 mg | ORAL_TABLET | Freq: Four times a day (QID) | ORAL | Status: DC | PRN
Start: 2015-12-12 — End: 2015-12-17

## 2015-12-12 MED ORDER — ONDANSETRON HCL 4 MG/2ML IJ SOLN: 4.0000 mg | Freq: Four times a day (QID) | INTRAMUSCULAR | Status: DC | PRN

## 2015-12-12 MED ORDER — PANTOPRAZOLE SODIUM 40 MG IV SOLR
40.0000 mg | Freq: Two times a day (BID) | INTRAVENOUS | Status: DC
  Administered 2015-12-12 – 2015-12-16 (×10): 40 mg via INTRAVENOUS
  Filled 2015-12-12 (×11): qty 40

## 2015-12-12 MED ORDER — SODIUM CHLORIDE 0.9 % IV BOLUS (SEPSIS)
1000.0000 mL | Freq: Once | INTRAVENOUS | Status: AC
  Administered 2015-12-12: 1000 mL via INTRAVENOUS

## 2015-12-12 MED ORDER — IOHEXOL 300 MG/ML  SOLN
100.0000 mL | Freq: Once | INTRAMUSCULAR | Status: AC | PRN
  Administered 2015-12-12: 100 mL via INTRAVENOUS

## 2015-12-12 MED ORDER — LORATADINE 10 MG PO TABS
10.0000 mg | ORAL_TABLET | Freq: Every day | ORAL | Status: DC
  Administered 2015-12-12 – 2015-12-17 (×6): 10 mg via ORAL
  Filled 2015-12-12 (×7): qty 1

## 2015-12-12 MED ORDER — PIPERACILLIN-TAZOBACTAM 3.375 G IVPB
3.3750 g | Freq: Three times a day (TID) | INTRAVENOUS | Status: DC
  Administered 2015-12-12 – 2015-12-15 (×8): 3.375 g via INTRAVENOUS
  Filled 2015-12-12 (×10): qty 50

## 2015-12-12 MED ORDER — BISACODYL 10 MG RE SUPP: 10.0000 mg | Freq: Every day | RECTAL | Status: DC | PRN

## 2015-12-12 MED ORDER — IPRATROPIUM-ALBUTEROL 0.5-2.5 (3) MG/3ML IN SOLN
3.0000 mL | Freq: Four times a day (QID) | RESPIRATORY_TRACT | Status: DC
  Administered 2015-12-12 – 2015-12-13 (×2): 3 mL via RESPIRATORY_TRACT
  Filled 2015-12-12 (×3): qty 3

## 2015-12-12 MED ORDER — VANCOMYCIN HCL IN DEXTROSE 1-5 GM/200ML-% IV SOLN
1000.0000 mg | INTRAVENOUS | Status: DC
  Administered 2015-12-13 – 2015-12-14 (×2): 1000 mg via INTRAVENOUS
  Filled 2015-12-12 (×3): qty 200

## 2015-12-12 MED ORDER — VANCOMYCIN HCL IN DEXTROSE 1-5 GM/200ML-% IV SOLN
1000.0000 mg | Freq: Once | INTRAVENOUS | Status: AC
  Administered 2015-12-12: 1000 mg via INTRAVENOUS
  Filled 2015-12-12: qty 200

## 2015-12-12 MED ORDER — DONEPEZIL HCL 5 MG PO TABS
10.0000 mg | ORAL_TABLET | Freq: Every day | ORAL | Status: DC
  Administered 2015-12-12 – 2015-12-16 (×5): 10 mg via ORAL
  Filled 2015-12-12 (×5): qty 2

## 2015-12-12 MED ORDER — LEVOFLOXACIN IN D5W 750 MG/150ML IV SOLN: 750.0000 mg | Freq: Once | INTRAVENOUS | Status: DC

## 2015-12-12 NOTE — Progress Notes (Signed)
Pt unable to perform incent. Spir.

## 2015-12-12 NOTE — ED Notes (Signed)
Patient transported to CT 

## 2015-12-12 NOTE — ED Notes (Addendum)
Pt arrived via EMS from [facility] with c/o vomiting.  Per EMS patient vomited x3 with them. Increased weakness also noted.

## 2015-12-12 NOTE — H&P (Signed)
History and Physical    [name] [id number] DOB: March 06, 1928 DOA: 12/12/2015  Referring physician: Dr. Edd Fabian PCP: Mathews Argyle, MD  Specialists: Dr. Vira Agar  Chief Complaint: N/V with weakness  HPI: [name] is a 82 y.o. female has a past medical history significant for CVA, recent pneumonia, and recent GI bleed(presumably jeujunal) now with recurrent N/V with dark blood per rectum. Hgb=7.9 in ER. Patient is extremely weak and is now admitted. Still c/o SOB and cough. Denies abdominal pain.  Review of Systems: The patient denies fever, weight loss,, vision loss, decreased hearing, hoarseness, chest pain, syncope, dyspnea on exertion, peripheral edema, balance deficits, hemoptysis, abdominal pain, severe indigestion/heartburn, hematuria, incontinence, genital sores, muscle weakness, suspicious skin lesions, transient blindness, difficulty walking, depression, unusual weight change,  enlarged lymph nodes, angioedema, and breast masses.   Past Medical History  Diagnosis Date  . Cancer ([place])   . Heart murmur   . Breast cancer ([place])     h/o  . CVA (cerebral infarction)   . GI bleed   . PNA (pneumonia)   . Dementia    Past Surgical History  Procedure Laterality Date  . Mastectomy  Rt side    1998  . Bladder tack    . Rotator cuff repair Right    Social History:  reports that she has never smoked. She does not have any smokeless tobacco history on file. She reports that she does not drink alcohol or use illicit drugs.  Allergies  Allergen Reactions  . Sulfa Antibiotics Hives    Family History  Problem Relation Age of Onset  . Leukemia Sister   . CAD Father     Prior to Admission medications   Medication Sig Start Date End Date Taking? Authorizing Provider  aspirin EC 81 MG EC tablet Take 1 tablet (81 mg total) by mouth daily. Patient not taking: Reported on 11/23/2015 11/13/15   Nicholes Mango, MD  cetirizine (ZYRTEC) 10 MG tablet Take 10 mg by mouth daily  as needed for allergies.    Historical Provider, MD  docusate sodium (COLACE) 100 MG capsule Take 1 capsule (100 mg total) by mouth 2 (two) times daily. Patient not taking: Reported on 11/23/2015 11/13/15   Nicholes Mango, MD  donepezil (ARICEPT) 10 MG tablet Take 10 mg by mouth at bedtime.    Historical Provider, MD  feeding supplement, ENSURE ENLIVE, (ENSURE ENLIVE) LIQD Take 237 mLs by mouth 3 (three) times daily with meals. 11/13/15   Nicholes Mango, MD  fluticasone (FLONASE) 50 MCG/ACT nasal spray Place 1 spray into both nostrils daily.    Historical Provider, MD  memantine (NAMENDA XR) 28 MG CP24 24 hr capsule Take 28 mg by mouth daily.    Historical Provider, MD  Menthol-Zinc Oxide (RISAMINE) 0.44-20.625 % OINT Apply 1 application topically as needed (for irritation).     Historical Provider, MD  metoprolol tartrate (LOPRESSOR) 25 MG tablet Take 25 mg by mouth 2 (two) times daily.    Historical Provider, MD  pantoprazole (PROTONIX) 40 MG tablet Take 1 tablet (40 mg total) by mouth daily. 11/13/15   Nicholes Mango, MD  pravastatin (PRAVACHOL) 40 MG tablet Take 1 tablet (40 mg total) by mouth daily at 6 PM. 11/13/15   Nicholes Mango, MD  QUEtiapine (SEROQUEL) 200 MG tablet Take 200 mg by mouth 2 (two) times daily as needed (for agitation).    Historical Provider, MD  sertraline (ZOLOFT) 100 MG tablet Take 100 mg by mouth daily.  Historical Provider, MD  traZODone (DESYREL) 50 MG tablet Take 50 mg by mouth at bedtime.    Historical Provider, MD  zinc oxide 20 % ointment Apply topically as needed for irritation. 11/13/15   Nicholes Mango, MD   Physical Exam: Filed Vitals:   12/12/15 1019 12/12/15 1030 12/12/15 1100 12/12/15 1134  BP: 174/77 167/71 156/77 133/65  Pulse:  61 61 72  Temp:      TempSrc:      Resp: 13 15 15 13   SpO2: 96% 98% 98% 100%     General: Chronically ill apperaring but in no apparent distress  Eyes: PERRL, EOMI, no scleral icterus  ENT: moist oropharynx  Neck: supple, no  lymphadenopathy  Cardiovascular: regular rate without MRG; 2+ peripheral pulses, no JVD, no peripheral edema  Respiratory: basilar rhonchi without wheezes or rale, respiratory effort is normal  Abdomen: soft, non tender to palpation, positive bowel sounds, no guarding, no rebound  Skin: no rashes  Musculoskeletal: normal bulk and tone, no joint swelling  Psychiatric: normal mood and affect  Neurologic: CN 2-12 grossly intact, MS 5/5 in all 4  Labs on Admission:  Basic Metabolic Panel:  Recent Labs Lab 12/12/15 0949  NA 139  K 3.2*  CL 107  CO2 23  GLUCOSE 158*  BUN 10  CREATININE 0.83  CALCIUM 8.7*   Liver Function Tests:  Recent Labs Lab 12/12/15 0949  AST 12*  ALT 9*  ALKPHOS 64  BILITOT 0.3  PROT 7.5  ALBUMIN 3.7    Recent Labs Lab 12/12/15 0949  LIPASE 74*   No results for input(s): AMMONIA in the last 168 hours. CBC:  Recent Labs Lab 12/12/15 0949  WBC 9.0  NEUTROABS 2.8  HGB 7.9*  HCT 24.8*  MCV 78.7*  PLT 157   Cardiac Enzymes:  Recent Labs Lab 12/12/15 0949  TROPONINI 0.05*    BNP (last 3 results) No results for input(s): BNP in the last 8760 hours.  ProBNP (last 3 results) No results for input(s): PROBNP in the last 8760 hours.  CBG:  Recent Labs Lab 12/12/15 0944  GLUCAP 148*    Radiological Exams on Admission: Ct Head Wo Contrast  12/12/2015  CLINICAL DATA:  Vomiting.  History of CVA and breast cancer. EXAM: CT HEAD WITHOUT CONTRAST TECHNIQUE: Contiguous axial images were obtained from the base of the skull through the vertex without intravenous contrast. COMPARISON:  Head CT dated 11/06/2015 and brain MRI dated 11/07/2015. FINDINGS: There is generalized brain atrophy with commensurate dilatation of the ventricles and sulci. There is an old infarct within the posterior left frontal lobe, diagnosed on MRI of 11/07/2015, with expected interval aging appearance. Additional chronic small vessel ischemic changes noted within  the bilateral periventricular white matter regions and small old lacunar infarcts within the bilateral basal ganglia regions. There is no mass, hemorrhage, edema or other evidence of acute parenchymal abnormality. No extra-axial hemorrhage. No acute osseous abnormality. Mucosal thickening noted within the left sphenoid sinus. Paranasal sinuses otherwise clear. Superficial soft tissues are unremarkable. IMPRESSION: 1. Atrophy and chronic ischemic changes, as detailed above. 2. No evidence of acute intracranial abnormality. No intracranial mass, hemorrhage or edema. 3. Left sphenoid sinus disease, of uncertain age but new compared to previous exams. Electronically Signed   By: Franki Cabot M.D.   On: 12/12/2015 10:23   Ct Abdomen Pelvis W Contrast  12/12/2015  CLINICAL DATA:  Vomiting.  History of breast cancer. EXAM: CT ABDOMEN AND PELVIS WITH CONTRAST TECHNIQUE: Multidetector CT  imaging of the abdomen and pelvis was performed using the standard protocol following bolus administration of intravenous contrast. CONTRAST:  179mL OMNIPAQUE IOHEXOL 300 MG/ML  SOLN COMPARISON:  03/05/2015 FINDINGS: Irregular and heterogeneous opacities of increased at the right lung base. There is some bronchiolectasis and thickening of the walls of the bronchi. Liver, gallbladder, spleen, pancreas, and adrenal glands are within normal limits. Mild chronic changes of the kidneys are present. Normal appendix Prominent stool burden in the rectum. No evidence of abnormal dilatation of bowel to suggest obstruction. Sigmoid diverticulosis. There is a small amount of free fluid adjacent to the sigmoid colon but no obvious Turri thickening. Bladder is unremarkable. Uterus is absent. Adnexa are within normal limits. No abnormal retroperitoneal adenopathy. Stable L2 compression fracture. IMPRESSION: Prominent stool burden in the rectum without evidence of bowel obstruction Sigmoid diverticulosis. There is free fluid adjacent to the sigmoid colon  but no Lamp thickening to suggest inflammatory change. This is a nonspecific finding. Increasing irregular and heterogeneous opacities at the right lung base. An inflammatory process such as aspiration may be present. Alternatively, findings may represent progressive chronic interstitial lung disease. CT chest may be helpful for further characterization. Electronically Signed   By: Marybelle Killings M.D.   On: 12/12/2015 11:44   Dg Chest Portable 1 View  12/12/2015  CLINICAL DATA:  Patient with vomiting.  Lethargy. EXAM: PORTABLE CHEST 1 VIEW COMPARISON:  Chest radiograph 11/24/2015 FINDINGS: Monitoring leads overlie the patient. Stable cardiac mediastinal contours. Heterogeneous opacities within the left mid lower lung as well as within the right lower lung. Possible small left pleural effusion. Biapical pleural parenchymal thickening. IMPRESSION: Interval development of left mid and lower lung as well as right lung base heterogeneous opacities concerning for pneumonia in the appropriate clinical setting. Aspiration could have a similar appearance. Recommend short term radiographic followup (3 weeks) to ensure resolution. Electronically Signed   By: Lovey Newcomer M.D.   On: 12/12/2015 11:07    EKG: Independently reviewed.  Assessment/Plan Principal Problem:   GI bleed Active Problems:   Anemia associated with acute blood loss   Persistent pneumonia   Will transfuse 1u PRBC's per ER MD. Admit to floor with IV fluids and IV ABX with SVN's. Follow hgb closely. Consult GI. Begin IV Protonix. Repeat labs and CXR in AM. Pt in DNR per family.  Diet: NPO Fluids: NS with K+@100  DVT Prophylaxis: none  Code Status: DNR  Family Communication: yes  Disposition Plan: SNF  Time spent: 55 min

## 2015-12-12 NOTE — ED Notes (Signed)
Returned from CT.

## 2015-12-12 NOTE — Progress Notes (Signed)
ANTIBIOTIC CONSULT NOTE - INITIAL  Pharmacy Consult for Vancomycin and Zosyn  Indication: pneumonia  Allergies  Allergen Reactions  . Sulfa Antibiotics Hives    Patient Measurements: Height: 5\' 6"  (167.6 cm) Weight: 128 lb 12.8 oz (58.423 kg) IBW/kg (Calculated) : 59.3  Vital Signs: Temp: 98.7 F (37.1 C) (01/07 1445) Temp Source: Oral (01/07 1445) BP: 147/56 mmHg (01/07 1445) Pulse Rate: 58 (01/07 1445) :  Recent Labs  12/12/15 0949  WBC 9.0  HGB 7.9*  PLT 157  CREATININE 0.83   Estimated Creatinine Clearance: 45.7 mL/min (by C-G formula based on Cr of 0.83). No results for input(s): VANCOTROUGH, VANCOPEAK, VANCORANDOM, GENTTROUGH, GENTPEAK, GENTRANDOM, TOBRATROUGH, TOBRAPEAK, TOBRARND, AMIKACINPEAK, AMIKACINTROU, AMIKACIN in the last 72 hours.   Microbiology: No results found for this or any previous visit (from the past 720 hour(s)).  Medical History: Past Medical History  Diagnosis Date  . Cancer ([place])   . Heart murmur   . Breast cancer ([place])     h/o  . CVA (cerebral infarction)   . GI bleed   . PNA (pneumonia)   . Dementia    Assessment: 82 yo female with recent pneumonia and GI bleed admitted with recurrent N/V and dark blood per rectum. Pharmacy consulted for vancomycin and Zosyn dosing and monitoring.   Goal of Therapy:  Vancomycin trough level 15-20 mcg/ml  Plan:  Ke: 0.042    T1/2: 16.5    Vd: 41  Patient received one dose of vancomycin 1g IV and Zosyn 3.375 in ED.  Will start patient on regimen of Vancomycin 1g q12 hours with 13 hour stack dosing. Calculated trough at Css is 15.5. Trough ordered prior to 3rd dose on 1/10 @ 0130.  Will start patient on Zosyn 3.375g IV EI q8h.  Pharmacy will continue to monitor labs and renal function and make adjustments as needed.    Nancy Fetter, PharmD Pharmacy Resident  12/12/2015,3:27 PM

## 2015-12-12 NOTE — ED Provider Notes (Signed)
[facility] Emergency Department Provider Note  ____________________________________________  Time seen: Approximately 9:53 AM  I have reviewed the triage vital signs and the nursing notes.   HISTORY  Chief Complaint Emesis  Caveat-history of present illness and review of systems Limited due to patient's dementia as well as severity of illness. Information obtained from EMS on their arrival as well as staff [facility].  HPI [name] is a 82 y.o. female with history of CVA, GI bleed, dementia, and hypertension who presents from [facility] for vomiting and generalized weakness that began just prior to arrival, constant since onset, moderate, no modifying factors. According to staff at [facility], the patient awoke this morning in her usual state of health. She was eating breakfast when she suddenly became weak, her "speech was slurred" and she began vomiting. No recent trauma. No diarrhea, fevers or chills. No recent complaints of pain. Staff does report that there is a "virus going around" at [facility] that has been causing runny nose and diarrhea in several residents.   Past Medical History  Diagnosis Date  . Cancer ([place])   . Heart murmur   . Breast cancer ([place])     h/o  . CVA (cerebral infarction)   . GI bleed   . PNA (pneumonia)   . Dementia     Patient Active Problem List   Diagnosis Date Noted  . GI bleed 12/12/2015  . Anemia associated with acute blood loss 12/12/2015  . Persistent pneumonia 12/12/2015  . Fall 11/23/2015  . CVA (cerebral infarction) 11/08/2015  . Encephalopathy 11/07/2015    Past Surgical History  Procedure Laterality Date  . Mastectomy  Rt side    1998  . Bladder tack    . Rotator cuff repair Right     Current Outpatient Rx  Name  Route  Sig  Dispense  Refill  . aspirin EC 81 MG EC tablet   Oral   Take 1 tablet (81 mg total) by mouth daily. Patient not taking: Reported on 11/23/2015   30  tablet   0   . cetirizine (ZYRTEC) 10 MG tablet   Oral   Take 10 mg by mouth daily as needed for allergies.         Marland Kitchen docusate sodium (COLACE) 100 MG capsule   Oral   Take 1 capsule (100 mg total) by mouth 2 (two) times daily. Patient not taking: Reported on 11/23/2015   10 capsule   0   . donepezil (ARICEPT) 10 MG tablet   Oral   Take 10 mg by mouth at bedtime.         . feeding supplement, ENSURE ENLIVE, (ENSURE ENLIVE) LIQD   Oral   Take 237 mLs by mouth 3 (three) times daily with meals.   237 mL   12   . fluticasone (FLONASE) 50 MCG/ACT nasal spray   Each Nare   Place 1 spray into both nostrils daily.         . memantine (NAMENDA XR) 28 MG CP24 24 hr capsule   Oral   Take 28 mg by mouth daily.         . Menthol-Zinc Oxide (RISAMINE) 0.44-20.625 % OINT   Apply externally   Apply 1 application topically as needed (for irritation).          . metoprolol tartrate (LOPRESSOR) 25 MG tablet   Oral   Take 25 mg by mouth 2 (two) times daily.         Marland Kitchen  pantoprazole (PROTONIX) 40 MG tablet   Oral   Take 1 tablet (40 mg total) by mouth daily.   30 tablet   0   . pravastatin (PRAVACHOL) 40 MG tablet   Oral   Take 1 tablet (40 mg total) by mouth daily at 6 PM.   30 tablet   0   . QUEtiapine (SEROQUEL) 200 MG tablet   Oral   Take 200 mg by mouth 2 (two) times daily as needed (for agitation).         . sertraline (ZOLOFT) 100 MG tablet   Oral   Take 100 mg by mouth daily.         . traZODone (DESYREL) 50 MG tablet   Oral   Take 50 mg by mouth at bedtime.         Marland Kitchen zinc oxide 20 % ointment   Topical   Apply topically as needed for irritation.   56.7 g   0     Allergies Sulfa antibiotics  Family History  Problem Relation Age of Onset  . Leukemia Sister   . CAD Father     Social History Social History  Substance Use Topics  . Smoking status: Never Smoker   . Smokeless tobacco: None  . Alcohol Use: No    Review of  Systems Constitutional: No fever/chills Gastrointestinal: + vomiting.    Caveat-history of present illness and review of systems Limited due to patient's dementia as well as severity of illness. Information obtained from EMS on their arrival as well as staff [facility].  ____________________________________________   PHYSICAL EXAM:  Filed Vitals:   12/12/15 1019 12/12/15 1030 12/12/15 1100 12/12/15 1134  BP: 174/77 167/71 156/77 133/65  Pulse:  61 61 72  Temp:      TempSrc:      Resp: 13 15 15 13   SpO2: 96% 98% 98% 100%   Constitutional: Appears fatigued and lethargic but opens eyes to voice and light touch, does not speak with does follow commands to move all extremities. Eyes: Conjunctivae are normal. PERRL. EOMI. Head: Atraumatic. Nose: No congestion/rhinnorhea. Mouth/Throat: Mucous membranes are dry.  Oropharynx non-erythematous. Neck: No stridor.  Cardiovascular: Normal rate, regular rhythm. Grossly normal heart sounds.  Good peripheral circulation. Respiratory: Normal respiratory effort.  No retractions. Lungs CTAB. Gastrointestinal: Soft and nontender. No distention. Normal bowel sounds Genitourinary: deferred Musculoskeletal: No lower extremity tenderness nor edema.  No joint effusions. Neurologic:  Follows commands to move all extremities which he appears to do equally however she does not cooperate with formal neurological testing. Skin:  Skin is warm, dry and intact. No rash noted. Marked skin pallor is noted. Psychiatric: Unable to assess  ____________________________________________   LABS (all labs ordered are listed, but only abnormal results are displayed)  Labs Reviewed  CBC WITH DIFFERENTIAL/PLATELET - Abnormal; Notable for the following:    RBC 3.15 (*)    Hemoglobin 7.9 (*)    HCT 24.8 (*)    MCV 78.7 (*)    MCH 25.0 (*)    MCHC 31.8 (*)    RDW 15.0 (*)    Lymphs Abs 6.2 (*)    Monocytes Absolute 0.0 (*)    All other components within normal  limits  COMPREHENSIVE METABOLIC PANEL - Abnormal; Notable for the following:    Potassium 3.2 (*)    Glucose, Bld 158 (*)    Calcium 8.7 (*)    AST 12 (*)    ALT 9 (*)    All  other components within normal limits  LIPASE, BLOOD - Abnormal; Notable for the following:    Lipase 74 (*)    All other components within normal limits  TROPONIN I - Abnormal; Notable for the following:    Troponin I 0.05 (*)    All other components within normal limits  URINALYSIS COMPLETEWITH MICROSCOPIC ([facility] ONLY) - Abnormal; Notable for the following:    Color, Urine YELLOW (*)    APPearance CLEAR (*)    Squamous Epithelial / LPF 0-5 (*)    All other components within normal limits  GLUCOSE, CAPILLARY - Abnormal; Notable for the following:    Glucose-Capillary 148 (*)    All other components within normal limits  CULTURE, BLOOD (ROUTINE X 2)  CULTURE, BLOOD (ROUTINE X 2)  TYPE AND SCREEN  ABO/RH  PREPARE RBC (CROSSMATCH)   ____________________________________________  EKG  ED ECG REPORT I, Narya Gavel, the attending physician, personally viewed and interpreted this ECG.   Date: 12/12/2015  EKG Time: 09:52  Rate: 66  Rhythm: normal sinus rhythm  Axis: normal  Intervals: Suddenly short PR interval at 102 ms but otherwise normal.  ST&T Change: No acute ST elevation.  ____________________________________________  RADIOLOGY  CT head IMPRESSION: 1. Atrophy and chronic ischemic changes, as detailed above. 2. No evidence of acute intracranial abnormality. No intracranial mass, hemorrhage or edema. 3. Left sphenoid sinus disease, of uncertain age but new compared to previous exams.  CXR IMPRESSION: Interval development of left mid and lower lung as well as right lung base heterogeneous opacities concerning for pneumonia in the appropriate clinical setting. Aspiration could have a similar appearance. Recommend short term radiographic followup (3 weeks) to ensure resolution.   CT  abdomen and pelvis IMPRESSION: Prominent stool burden in the rectum without evidence of bowel obstruction  Sigmoid diverticulosis. There is free fluid adjacent to the sigmoid colon but no Matty thickening to suggest inflammatory change. This is a nonspecific finding.  Increasing irregular and heterogeneous opacities at the right lung base. An inflammatory process such as aspiration may be present. Alternatively, findings may represent progressive chronic interstitial lung disease. CT chest may be helpful for further characterization. ____________________________________________   PROCEDURES  Procedure(s) performed: None  Critical Care performed: No  ____________________________________________   INITIAL IMPRESSION / ASSESSMENT AND PLAN / ED COURSE  Pertinent labs & imaging results that were available during my care of the patient were reviewed by me and considered in my medical decision making (see chart for details).  YOUSRA WEBBER is a 82 y.o. female with history of CVA, GI bleed, dementia, and hypertension who presents from [facility] for vomiting and generalized weakness that began just prior to arrival. On exam, she is appears pale, weak, fatigued. She does not speak but she is able to follow commands to move all of her extremities equally. Vital signs are stable, she is afebrile. EKG not consistent with ischemia. Nontender abdominal exam. We'll give IV fluids, antiemetics, obtain screening labs, UA as well as CT head to rule out hemorrhage given change in mental status and vomiting.  ----------------------------------------- 12:23 PM on 12/12/2015 ----------------------------------------- Labs reviewed are notable for significant anemia with hemoglobin 7.9, down trending from 8.5 approximately 2 weeks ago. She does have melena in the rectal vault which is strongly guaiac positive. Her family has been consented and she will receive 1 unit of packed red blood cells for  continued GI bleed. Additionally, chest x-ray is concerning for interval development of pneumonia. We'll give IV vancomycin,  Zosyn, Levaquin for treatment of healthcare associated pneumonia and given recent admission last month. CT head negative for any hemorrhage. CT abdomen and pelvis shows no acute abdominal pathology.  ____________________________________________   FINAL CLINICAL IMPRESSION(S) / ED DIAGNOSES  Final diagnoses:  Vomiting without nausea, intractability of vomiting not specified, unspecified vomiting type  Weakness  Healthcare-associated pneumonia  Gastrointestinal hemorrhage with melena      Jalyssa Gavel, MD 12/12/15 1231

## 2015-12-12 NOTE — ED Notes (Signed)
Family at bedside. 2 daughters

## 2015-12-12 NOTE — ED Notes (Signed)
Lab called stating blood was ready.

## 2015-12-12 NOTE — Plan of Care (Signed)
Problem: Bowel/Gastric: Goal: Will show no signs and symptoms of gastrointestinal bleeding Outcome: Progressing 1 (one) unit PRBC's given as ordered. No reaction noted. Tolerated well.  Problem: Education: Goal: Knowledge of [facility] Education information/materials will improve Outcome: Progressing Unable to educate pt. Oriented to self. Has received General Education Handout. Oriented to Oncology unit. Instructed and Demonstrated how to use phone to call RN and CNA for Assistance. Unable. Family educated and remains at bedside.

## 2015-12-13 ENCOUNTER — Inpatient Hospital Stay: Payer: Medicare Other

## 2015-12-13 DIAGNOSIS — R1111 Vomiting without nausea: Secondary | ICD-10-CM

## 2015-12-13 DIAGNOSIS — R1110 Vomiting, unspecified: Secondary | ICD-10-CM | POA: Insufficient documentation

## 2015-12-13 DIAGNOSIS — D62 Acute posthemorrhagic anemia: Secondary | ICD-10-CM

## 2015-12-13 LAB — TYPE AND SCREEN
ABO/RH(D): O NEG
Antibody Screen: NEGATIVE
Unit division: 0

## 2015-12-13 LAB — COMPREHENSIVE METABOLIC PANEL
ALT: 9 U/L — AB (ref 14–54)
AST: 13 U/L — ABNORMAL LOW (ref 15–41)
Albumin: 3.4 g/dL — ABNORMAL LOW (ref 3.5–5.0)
Alkaline Phosphatase: 57 U/L (ref 38–126)
Anion gap: 8 (ref 5–15)
BUN: 7 mg/dL (ref 6–20)
CHLORIDE: 110 mmol/L (ref 101–111)
CO2: 24 mmol/L (ref 22–32)
CREATININE: 0.77 mg/dL (ref 0.44–1.00)
Calcium: 9 mg/dL (ref 8.9–10.3)
Glucose, Bld: 96 mg/dL (ref 65–99)
POTASSIUM: 3.7 mmol/L (ref 3.5–5.1)
Sodium: 142 mmol/L (ref 135–145)
Total Bilirubin: 1.3 mg/dL — ABNORMAL HIGH (ref 0.3–1.2)
Total Protein: 6.8 g/dL (ref 6.5–8.1)

## 2015-12-13 LAB — CBC
HCT: 28.8 % — ABNORMAL LOW (ref 35.0–47.0)
Hemoglobin: 9.6 g/dL — ABNORMAL LOW (ref 12.0–16.0)
MCH: 26.9 pg (ref 26.0–34.0)
MCHC: 33.3 g/dL (ref 32.0–36.0)
MCV: 80.8 fL (ref 80.0–100.0)
PLATELETS: 136 10*3/uL — AB (ref 150–440)
RBC: 3.56 MIL/uL — AB (ref 3.80–5.20)
RDW: 14.6 % — ABNORMAL HIGH (ref 11.5–14.5)
WBC: 7.1 10*3/uL (ref 3.6–11.0)

## 2015-12-13 MED ORDER — IPRATROPIUM-ALBUTEROL 0.5-2.5 (3) MG/3ML IN SOLN: 3.0000 mL | RESPIRATORY_TRACT | Status: DC | PRN

## 2015-12-13 MED ORDER — MUPIROCIN 2 % EX OINT
1.0000 "application " | TOPICAL_OINTMENT | Freq: Two times a day (BID) | CUTANEOUS | Status: DC
  Administered 2015-12-13 – 2015-12-17 (×9): 1 via NASAL
  Filled 2015-12-13: qty 22

## 2015-12-13 MED ORDER — CHLORHEXIDINE GLUCONATE CLOTH 2 % EX PADS
6.0000 | MEDICATED_PAD | Freq: Every day | CUTANEOUS | Status: AC
  Administered 2015-12-13 – 2015-12-17 (×5): 6 via TOPICAL

## 2015-12-13 NOTE — Consult Note (Signed)
[facility]  [address], [place], [place] [address] Phone: [phone] Fax : [phone]  Consultation  Referring Provider:     No ref. provider found Primary Care Physician:  Mathews Argyle, MD Primary Gastroenterologist:  Dr. Vira Agar         Reason for Consultation:     GI bleed  Date of Admission:  12/12/2015 Date of Consultation:  12/13/2015         HPI:   [name] is a 82 y.o. female who was admitted with a GI bleed. The patient has a recent discharge from the hospital 1 month ago for a GI bleed. The patient had a bleeding scan that showed positive bleeding in the sigmoid colon. She was readmitted and discharged recently after the GI bleed with pneumonia. She now comes in with nausea and vomiting and reported dark blood per rectum. The patient's hemoglobin on admission was 7.9 and is 9.6 this morning. The patient was transfused 1 unit of blood last night. The patient was seen by Dr. Vira Agar 1 month ago for the same problem of GI bleeding. At that time the patient was deemed not a candidate for colonoscopy due to the dementia and inability to take a prep. She was monitored throughout the entire course of her stay and a angiography was recommended if she continued bleeding. From Dr. Doy Hutching note on admission there is a report of the last GI bleed being presumed from a jejunal sauce although I cannot find any record of that in the patient's chart but there was a positive bleeding scan for the sigmoid colon.  Past Medical History  Diagnosis Date  . Cancer ([place])   . Heart murmur   . Breast cancer ([place])     h/o  . CVA (cerebral infarction)   . GI bleed   . PNA (pneumonia)   . Dementia     Past Surgical History  Procedure Laterality Date  . Mastectomy  Rt side    1998  . Bladder tack    . Rotator cuff repair Right     Prior to Admission medications   Medication Sig Start Date End Date Taking? Authorizing Provider  cetirizine (ZYRTEC) 10 MG tablet  Take 10 mg by mouth daily as needed for allergies.   Yes Historical Provider, MD  donepezil (ARICEPT) 10 MG tablet Take 10 mg by mouth at bedtime.   Yes Historical Provider, MD  feeding supplement, ENSURE ENLIVE, (ENSURE ENLIVE) LIQD Take 237 mLs by mouth 3 (three) times daily with meals. 11/13/15  Yes Nicholes Mango, MD  fluticasone (FLONASE) 50 MCG/ACT nasal spray Place 1 spray into both nostrils daily.   Yes Historical Provider, MD  memantine (NAMENDA XR) 28 MG CP24 24 hr capsule Take 28 mg by mouth daily.   Yes Historical Provider, MD  Menthol-Zinc Oxide (RISAMINE) 0.44-20.625 % OINT Apply 1 application topically as needed (for irritation).    Yes Historical Provider, MD  metoprolol tartrate (LOPRESSOR) 25 MG tablet Take 25 mg by mouth 2 (two) times daily.   Yes Historical Provider, MD  pantoprazole (PROTONIX) 40 MG tablet Take 1 tablet (40 mg total) by mouth daily. 11/13/15  Yes Nicholes Mango, MD  pravastatin (PRAVACHOL) 40 MG tablet Take 1 tablet (40 mg total) by mouth daily at 6 PM. 11/13/15  Yes Nicholes Mango, MD  QUEtiapine (SEROQUEL) 200 MG tablet Take 200 mg by mouth 2 (two) times daily as needed (for agitation).   Yes Historical Provider, MD  sertraline (ZOLOFT)  100 MG tablet Take 100 mg by mouth daily.   Yes Historical Provider, MD  traZODone (DESYREL) 50 MG tablet Take 50 mg by mouth at bedtime.   Yes Historical Provider, MD  zinc oxide 20 % ointment Apply topically as needed for irritation. 11/13/15  Yes Nicholes Mango, MD  aspirin EC 81 MG EC tablet Take 1 tablet (81 mg total) by mouth daily. Patient not taking: Reported on 11/23/2015 11/13/15   Nicholes Mango, MD  docusate sodium (COLACE) 100 MG capsule Take 1 capsule (100 mg total) by mouth 2 (two) times daily. Patient not taking: Reported on 11/23/2015 11/13/15   Nicholes Mango, MD    Family History  Problem Relation Age of Onset  . Leukemia Sister   . CAD Father      Social History  Substance Use Topics  . Smoking status: Never Smoker   .  Smokeless tobacco: None  . Alcohol Use: No    Allergies as of 12/12/2015 - Review Complete 12/12/2015  Allergen Reaction Noted  . Sulfa antibiotics Hives 11/06/2015    Review of Systems:    All systems reviewed and negative except where noted in HPI.   Physical Exam:  Vital signs in last 24 hours: Temp:  [97.5 F (36.4 C)-98.7 F (37.1 C)] 97.6 F (36.4 C) (01/08 0523) Pulse Rate:  [57-78] 58 (01/08 0523) Resp:  [12-20] 17 (01/08 0523) BP: (133-185)/(56-98) 152/61 mmHg (01/08 0523) SpO2:  [96 %-100 %] 98 % (01/08 0749) Weight:  [128 lb 12.8 oz (58.423 kg)-133 lb 14.4 oz (60.737 kg)] 133 lb 14.4 oz (60.737 kg) (01/08 0525) Last BM Date: 12/12/15 General:   Pleasant, cooperative in NAD Head:  Normocephalic and atraumatic. Eyes:   No icterus.   Conjunctiva pink. PERRLA. Ears:  Normal auditory acuity. Neck:  Supple; no masses or thyroidomegaly Lungs: Respirations even and unlabored. Lungs clear to auscultation bilaterally.   No wheezes, crackles, or rhonchi.  Heart:  Regular rate and rhythm;  Without murmur, clicks, rubs or gallops Abdomen:  Soft, nondistended, nontender. Normal bowel sounds. No appreciable masses or hepatomegaly.  No rebound or guarding.  Rectal:  Not performed. Msk:  Symmetrical without gross deformities.  Strength  Extremities:  Without edema, cyanosis or clubbing. Neurologic:  Alert and confused Skin:  Intact without significant lesions or rashes. Cervical Nodes:  No significant cervical adenopathy. Psych:  Alert and cooperative. Normal affect.  LAB RESULTS:  Recent Labs  12/12/15 0949 12/13/15 0437  WBC 9.0 7.1  HGB 7.9* 9.6*  HCT 24.8* 28.8*  PLT 157 136*   BMET  Recent Labs  12/12/15 0949 12/13/15 0437  NA 139 142  K 3.2* 3.7  CL 107 110  CO2 23 24  GLUCOSE 158* 96  BUN 10 7  CREATININE 0.83 0.77  CALCIUM 8.7* 9.0   LFT  Recent Labs  12/13/15 0437  PROT 6.8  ALBUMIN 3.4*  AST 13*  ALT 9*  ALKPHOS 57  BILITOT 1.3*    PT/INR No results for input(s): LABPROT, INR in the last 72 hours.  STUDIES: Ct Head Wo Contrast  12/12/2015  CLINICAL DATA:  Vomiting.  History of CVA and breast cancer. EXAM: CT HEAD WITHOUT CONTRAST TECHNIQUE: Contiguous axial images were obtained from the base of the skull through the vertex without intravenous contrast. COMPARISON:  Head CT dated 11/06/2015 and brain MRI dated 11/07/2015. FINDINGS: There is generalized brain atrophy with commensurate dilatation of the ventricles and sulci. There is an old infarct within the posterior left frontal  lobe, diagnosed on MRI of 11/07/2015, with expected interval aging appearance. Additional chronic small vessel ischemic changes noted within the bilateral periventricular white matter regions and small old lacunar infarcts within the bilateral basal ganglia regions. There is no mass, hemorrhage, edema or other evidence of acute parenchymal abnormality. No extra-axial hemorrhage. No acute osseous abnormality. Mucosal thickening noted within the left sphenoid sinus. Paranasal sinuses otherwise clear. Superficial soft tissues are unremarkable. IMPRESSION: 1. Atrophy and chronic ischemic changes, as detailed above. 2. No evidence of acute intracranial abnormality. No intracranial mass, hemorrhage or edema. 3. Left sphenoid sinus disease, of uncertain age but new compared to previous exams. Electronically Signed   By: Franki Cabot M.D.   On: 12/12/2015 10:23   Ct Abdomen Pelvis W Contrast  12/12/2015  CLINICAL DATA:  Vomiting.  History of breast cancer. EXAM: CT ABDOMEN AND PELVIS WITH CONTRAST TECHNIQUE: Multidetector CT imaging of the abdomen and pelvis was performed using the standard protocol following bolus administration of intravenous contrast. CONTRAST:  148mL OMNIPAQUE IOHEXOL 300 MG/ML  SOLN COMPARISON:  03/05/2015 FINDINGS: Irregular and heterogeneous opacities of increased at the right lung base. There is some bronchiolectasis and thickening of the  walls of the bronchi. Liver, gallbladder, spleen, pancreas, and adrenal glands are within normal limits. Mild chronic changes of the kidneys are present. Normal appendix Prominent stool burden in the rectum. No evidence of abnormal dilatation of bowel to suggest obstruction. Sigmoid diverticulosis. There is a small amount of free fluid adjacent to the sigmoid colon but no obvious Osuch thickening. Bladder is unremarkable. Uterus is absent. Adnexa are within normal limits. No abnormal retroperitoneal adenopathy. Stable L2 compression fracture. IMPRESSION: Prominent stool burden in the rectum without evidence of bowel obstruction Sigmoid diverticulosis. There is free fluid adjacent to the sigmoid colon but no Haigler thickening to suggest inflammatory change. This is a nonspecific finding. Increasing irregular and heterogeneous opacities at the right lung base. An inflammatory process such as aspiration may be present. Alternatively, findings may represent progressive chronic interstitial lung disease. CT chest may be helpful for further characterization. Electronically Signed   By: Marybelle Killings M.D.   On: 12/12/2015 11:44   Dg Chest Portable 1 View  12/12/2015  CLINICAL DATA:  Patient with vomiting.  Lethargy. EXAM: PORTABLE CHEST 1 VIEW COMPARISON:  Chest radiograph 11/24/2015 FINDINGS: Monitoring leads overlie the patient. Stable cardiac mediastinal contours. Heterogeneous opacities within the left mid lower lung as well as within the right lower lung. Possible small left pleural effusion. Biapical pleural parenchymal thickening. IMPRESSION: Interval development of left mid and lower lung as well as right lung base heterogeneous opacities concerning for pneumonia in the appropriate clinical setting. Aspiration could have a similar appearance. Recommend short term radiographic followup (3 weeks) to ensure resolution. Electronically Signed   By: Lovey Newcomer M.D.   On: 12/12/2015 11:07      Impression / Plan:    [name] is a 82 y.o. y/o female with who comes in with a GI bleed. The patient had a recent GI bleed 1 month ago and was deemed not a candidate for a colonoscopy due to her inability to drink the prep and history of dementia. Patient had a bleeding scan at that time that showed her to have bleeding in the sigmoid colon. The patient continues to be unaware of where she is or the date. I do not disagree with Dr. Percell Boston past assessment that this patient is not a candidate for colonoscopy. If bleeding continues consider  repeating a bleeding scan versus vascular surgery for angiography with embolization. These do not hesitate to call with any questions.   Thank you for involving me in the care of this patient.      LOS: 1 day   Ollen Bowl, MD  12/13/2015, 9:16 AM   Note: This dictation was prepared with Dragon dictation along with smaller phrase technology. Any transcriptional errors that result from this process are unintentional.

## 2015-12-13 NOTE — Progress Notes (Signed)
[facility] [place] NAME: [name]    MR#:  [id number]  DATE OF BIRTH:  Mar 12, 1928  SUBJECTIVE:   Patient presented with nausea and vomiting and dark blood per rectum. Patient has dementia.  REVIEW OF SYSTEMS:    Review of Systems  Unable to perform ROS: dementia    Tolerating Diet: NPO      DRUG ALLERGIES:   Allergies  Allergen Reactions  . Sulfa Antibiotics Hives    VITALS:  Blood pressure 152/61, pulse 58, temperature 97.6 F (36.4 C), temperature source Oral, resp. rate 17, height 5\' 6"  (1.676 m), weight 60.737 kg (133 lb 14.4 oz), SpO2 98 %.  PHYSICAL EXAMINATION:   Physical Exam  Constitutional: She is well-developed, well-nourished, and in no distress. No distress.  HENT:  Head: Normocephalic.  Eyes: No scleral icterus.  Neck: Normal range of motion. No JVD present. No tracheal deviation present.  Cardiovascular: Normal rate and regular rhythm.  Exam reveals no gallop and no friction rub.   Murmur heard. Pulmonary/Chest: Effort normal and breath sounds normal. No respiratory distress. She has no wheezes. She has no rales. She exhibits no tenderness.  Abdominal: Soft. Bowel sounds are normal. She exhibits no distension and no mass. There is no tenderness. There is no rebound and no guarding.  Musculoskeletal: Normal range of motion. She exhibits no edema.  Neurological: She is alert.  Skin: Skin is warm. No rash noted. No erythema.      LABORATORY PANEL:   CBC  Recent Labs Lab 12/13/15 0437  WBC 7.1  HGB 9.6*  HCT 28.8*  PLT 136*   ------------------------------------------------------------------------------------------------------------------  Chemistries   Recent Labs Lab 12/13/15 0437  NA 142  K 3.7  CL 110  CO2 24  GLUCOSE 96  BUN 7  CREATININE 0.77  CALCIUM 9.0  AST 13*  ALT 9*  ALKPHOS 57  BILITOT 1.3*    ------------------------------------------------------------------------------------------------------------------  Cardiac Enzymes  Recent Labs Lab 12/12/15 0949  TROPONINI 0.05*   ------------------------------------------------------------------------------------------------------------------  RADIOLOGY:  Ct Head Wo Contrast  12/12/2015  CLINICAL DATA:  Vomiting.  History of CVA and breast cancer. EXAM: CT HEAD WITHOUT CONTRAST TECHNIQUE: Contiguous axial images were obtained from the base of the skull through the vertex without intravenous contrast. COMPARISON:  Head CT dated 11/06/2015 and brain MRI dated 11/07/2015. FINDINGS: There is generalized brain atrophy with commensurate dilatation of the ventricles and sulci. There is an old infarct within the posterior left frontal lobe, diagnosed on MRI of 11/07/2015, with expected interval aging appearance. Additional chronic small vessel ischemic changes noted within the bilateral periventricular white matter regions and small old lacunar infarcts within the bilateral basal ganglia regions. There is no mass, hemorrhage, edema or other evidence of acute parenchymal abnormality. No extra-axial hemorrhage. No acute osseous abnormality. Mucosal thickening noted within the left sphenoid sinus. Paranasal sinuses otherwise clear. Superficial soft tissues are unremarkable. IMPRESSION: 1. Atrophy and chronic ischemic changes, as detailed above. 2. No evidence of acute intracranial abnormality. No intracranial mass, hemorrhage or edema. 3. Left sphenoid sinus disease, of uncertain age but new compared to previous exams. Electronically Signed   By: Franki Cabot M.D.   On: 12/12/2015 10:23   Ct Abdomen Pelvis W Contrast  12/12/2015  CLINICAL DATA:  Vomiting.  History of breast cancer. EXAM: CT ABDOMEN AND PELVIS WITH CONTRAST TECHNIQUE: Multidetector CT imaging of the abdomen and pelvis was performed using the standard protocol following bolus administration  of intravenous  contrast. CONTRAST:  186mL OMNIPAQUE IOHEXOL 300 MG/ML  SOLN COMPARISON:  03/05/2015 FINDINGS: Irregular and heterogeneous opacities of increased at the right lung base. There is some bronchiolectasis and thickening of the walls of the bronchi. Liver, gallbladder, spleen, pancreas, and adrenal glands are within normal limits. Mild chronic changes of the kidneys are present. Normal appendix Prominent stool burden in the rectum. No evidence of abnormal dilatation of bowel to suggest obstruction. Sigmoid diverticulosis. There is a small amount of free fluid adjacent to the sigmoid colon but no obvious [place] thickening. Bladder is unremarkable. Uterus is absent. Adnexa are within normal limits. No abnormal retroperitoneal adenopathy. Stable L2 compression fracture. IMPRESSION: Prominent stool burden in the rectum without evidence of bowel obstruction Sigmoid diverticulosis. There is free fluid adjacent to the sigmoid colon but no Perl thickening to suggest inflammatory change. This is a nonspecific finding. Increasing irregular and heterogeneous opacities at the right lung base. An inflammatory process such as aspiration may be present. Alternatively, findings may represent progressive chronic interstitial lung disease. CT chest may be helpful for further characterization. Electronically Signed   By: Marybelle Killings M.D.   On: 12/12/2015 11:44   Dg Chest Portable 1 View  12/12/2015  CLINICAL DATA:  Patient with vomiting.  Lethargy. EXAM: PORTABLE CHEST 1 VIEW COMPARISON:  Chest radiograph 11/24/2015 FINDINGS: Monitoring leads overlie the patient. Stable cardiac mediastinal contours. Heterogeneous opacities within the left mid lower lung as well as within the right lower lung. Possible small left pleural effusion. Biapical pleural parenchymal thickening. IMPRESSION: Interval development of left mid and lower lung as well as right lung base heterogeneous opacities concerning for pneumonia in the appropriate  clinical setting. Aspiration could have a similar appearance. Recommend short term radiographic followup (3 weeks) to ensure resolution. Electronically Signed   By: Lovey Newcomer M.D.   On: 12/12/2015 11:07     ASSESSMENT AND PLAN:   82 year old female with a history of CVA and recent GI bleed who presents again with a GI bleed.  1. GI bleed: Patient had a recent GI bleed about a month ago and was not deemed a candidate for colonoscopy due to her inability to drink the prep and history of dementia. She had a bleeding scan at that time which did show bleeding in the sigmoid colon. Patient has been seen by GI during this hospitalization who also feels that at this point patient is not a candidate for colonoscopy. Her hemoglobin remained stable. If she continues to have bleeding then I will order a GI bleeding scan and she may need angiography with embolization.  2. Recent pneumonia: It appears by x-ray this may be aspiration. Speech consult for tomorrow. Continue Levaquin and Zosyn.  3. Dementia: Continue Zoloft, Seroquel, donepezil and Namenda.     CODE STATUS: DNR  TOTAL TIME TAKING CARE OF THIS PATIENT: 30 minutes.     POSSIBLE D/C tomorrow, DEPENDING ON CLINICAL CONDITION.  D/w Dr Allen Norris   Bettey Costa M.D on 12/13/2015 at 10:16 AM  Between 7am to 6pm - Pager - [phone] After 6pm go to [email or web address] - password [email or web address] [facility]  Office  [phone]  CC: Primary care physician; Mathews Argyle, MD  Note: This dictation was prepared with Dragon dictation along with smaller phrase technology. Any transcriptional errors that result from this process are unintentional.

## 2015-12-13 NOTE — Progress Notes (Signed)
Patient's duoneb's changed to prn due to current assessment.  Patient saturations have remained greater than 98% on room air, patient has no wheezes, rhonchi, or rhales and no shortness of breath.  Breath sounds have remained bilaterally clear.  Patient's RN has been made aware of these changes.

## 2015-12-13 NOTE — Progress Notes (Signed)
Initial Nutrition Assessment     INTERVENTION:   Meals and Snacks: diet progression, tolerance; cater to pt preferences Medical Food Supplement Therapy: recommend addition of Ensure Enlive po BID, each supplement provides 350 kcal and 20 grams of protein, once diet advanced  NUTRITION DIAGNOSIS:   Inadequate oral intake related to acute illness, altered GI function as evidenced by NPO status.  GOAL:   Patient will meet greater than or equal to 90% of their needs  MONITOR:    (Energy Intake, Anthropometrics, Digestive System, Electrolyte/Renal Profile)  REASON FOR ASSESSMENT:   Consult Poor PO  ASSESSMENT:    Pt admitetd with N/V with GI bleed, pneumonia  Past Medical History  Diagnosis Date  . Cancer ([place])   . Heart murmur   . Breast cancer ([place])     h/o  . CVA (cerebral infarction)   . GI bleed   . PNA (pneumonia)   . Dementia      Diet Order:  Diet clear liquid Room service appropriate?: Yes; Fluid consistency:: Thin   Energy Intake: pt NPO on visit this AM, diet just advanced  Food and Nutrition Related History: pt reports poor appetite for 1-2 months, reports barely eating anything during this time frame.   Last BM:  1/7  Electrolyte and Renal Profile:  Recent Labs Lab 12/12/15 0949 12/13/15 0437  BUN 10 7  CREATININE 0.83 0.77  NA 139 142  K 3.2* 3.7   Glucose Profile:   Recent Labs  12/12/15 0944  GLUCAP 148*   Meds: NS at 100 ml/hr  Nutrition Focused Physical Exam: Difficult to assess as pt on side and did not really participate in exam, would not move legs, etc.   Height:   Ht Readings from Last 1 Encounters:  12/12/15 5\' 6"  (1.676 m)    Weight: pt thinks she may have lost a few pounds but not really sure  Wt Readings from Last 1 Encounters:  12/13/15 133 lb 14.4 oz (60.737 kg)    Filed Weights   12/12/15 1445 12/13/15 0525  Weight: 128 lb 12.8 oz (58.423 kg) 133 lb 14.4 oz (60.737 kg)   Wt Readings from Last 10  Encounters:  12/13/15 133 lb 14.4 oz (60.737 kg)  11/23/15 145 lb 8.1 oz (66 kg)  11/13/15 132 lb 11.2 oz (60.192 kg)    BMI:  Body mass index is 21.62 kg/(m^2).  Estimated Nutritional Needs:   Kcal:  [id number] kcals (BEE 1069, 1.3 AF, 1.1-1.2 IF)   Protein:  61-73 g (1.0-1.2 g/kg)   Fluid:  1525-1830 mL (25-30 ml/kg)   MODERATE Care Level  Kerman Passey MS, RD, LDN [phone] Pager  [phone] Weekend/On-Call Pager

## 2015-12-14 ENCOUNTER — Inpatient Hospital Stay: Payer: Medicare Other

## 2015-12-14 LAB — CBC
HEMATOCRIT: 29.4 % — AB (ref 35.0–47.0)
Hemoglobin: 9.4 g/dL — ABNORMAL LOW (ref 12.0–16.0)
MCH: 25.9 pg — ABNORMAL LOW (ref 26.0–34.0)
MCHC: 32.1 g/dL (ref 32.0–36.0)
MCV: 80.6 fL (ref 80.0–100.0)
PLATELETS: 124 10*3/uL — AB (ref 150–440)
RBC: 3.65 MIL/uL — AB (ref 3.80–5.20)
RDW: 15.1 % — AB (ref 11.5–14.5)
WBC: 6.9 10*3/uL (ref 3.6–11.0)

## 2015-12-14 MED ORDER — ASPIRIN 300 MG RE SUPP: 300.0000 mg | Freq: Every day | RECTAL | Status: DC

## 2015-12-14 MED ORDER — QUETIAPINE FUMARATE 50 MG PO TABS: 50.0000 mg | ORAL_TABLET | Freq: Two times a day (BID) | ORAL | Status: AC | PRN

## 2015-12-14 MED ORDER — ASPIRIN 81 MG PO CHEW
81.0000 mg | CHEWABLE_TABLET | Freq: Every day | ORAL | Status: DC
  Administered 2015-12-14 – 2015-12-17 (×4): 81 mg via ORAL
  Filled 2015-12-14 (×4): qty 1

## 2015-12-14 MED ORDER — LEVOFLOXACIN 750 MG PO TABS: 750.0000 mg | ORAL_TABLET | Freq: Every day | ORAL | Status: AC

## 2015-12-14 MED ORDER — ATORVASTATIN CALCIUM 20 MG PO TABS
20.0000 mg | ORAL_TABLET | Freq: Every day | ORAL | Status: DC
  Administered 2015-12-14 – 2015-12-16 (×3): 20 mg via ORAL
  Filled 2015-12-14 (×3): qty 1

## 2015-12-14 NOTE — NC FL2 (Deleted)
[place] LEVEL OF CARE SCREENING TOOL     IDENTIFICATION  Patient Name: [name] Birthdate: 1928-08-14 Sex: female Admission Date (Current Location): 12/12/2015  [facility] and [place] Number:  Engineering geologist and Address:  [facility], [address] [place], [place] [address]      Provider Number: [id number]  Attending Physician Name and Address:  Bettey Costa, MD  Relative Name and Phone Number:       Current Level of Care: Hospital Recommended Level of Care: Memory Care Prior Approval Number:    Date Approved/Denied:   PASRR Number:    Discharge Plan: Domiciliary (Rest home)    Current Diagnoses: Patient Active Problem List   Diagnosis Date Noted  . Vomiting alone   . GI bleed 12/12/2015  . Anemia associated with acute blood loss 12/12/2015  . Persistent pneumonia 12/12/2015  . Fall 11/23/2015  . CVA (cerebral infarction) 11/08/2015  . Encephalopathy 11/07/2015    Orientation RESPIRATION BLADDER Height & Weight    Self  Normal Incontinent 5\' 6"  (167.6 cm) 130 lbs.  BEHAVIORAL SYMPTOMS/MOOD NEUROLOGICAL BOWEL NUTRITION STATUS      Incontinent Diet (Regular Diet)  AMBULATORY STATUS COMMUNICATION OF NEEDS Skin   Limited Assist Verbally Normal                       Personal Care Assistance Level of Assistance  Bathing, Feeding, Dressing Bathing Assistance: Limited assistance Feeding assistance: Limited assistance Dressing Assistance: Limited assistance     Functional Limitations Info  Sight, Hearing, Speech Sight Info: Impaired Hearing Info: Impaired Speech Info: Impaired    SPECIAL CARE FACTORS FREQUENCY                       Contractures      Additional Factors Info  Code Status, Allergies Code Status Info: DNR Allergies Info: Sulfa Antibiotics           Current Medications (12/14/2015):  This is the current hospital active medication list Current Facility-Administered  Medications  Medication Dose Route Frequency Provider Last Rate Last Dose  . 0.9 % NaCl with KCl 40 mEq / L  infusion   Intravenous Continuous Idelle Crouch, MD 100 mL/hr at 12/13/15 2126 100 mL/hr at 12/13/15 2126  . acetaminophen (TYLENOL) tablet 650 mg  650 mg Oral Q6H PRN Idelle Crouch, MD       Or  . acetaminophen (TYLENOL) suppository 650 mg  650 mg Rectal Q6H PRN Idelle Crouch, MD      . bisacodyl (DULCOLAX) suppository 10 mg  10 mg Rectal Daily PRN Idelle Crouch, MD      . Chlorhexidine Gluconate Cloth 2 % PADS 6 each  6 each Topical Q0600 Bettey Costa, MD   6 each at 12/14/15 0600  . docusate sodium (COLACE) capsule 100 mg  100 mg Oral BID Idelle Crouch, MD   100 mg at 12/14/15 0929  . donepezil (ARICEPT) tablet 10 mg  10 mg Oral QHS Idelle Crouch, MD   10 mg at 12/13/15 2125  . fluticasone (FLONASE) 50 MCG/ACT nasal spray 1 spray  1 spray Each Nare Daily Idelle Crouch, MD   1 spray at 12/14/15 0932  . ipratropium-albuterol (DUONEB) 0.5-2.5 (3) MG/3ML nebulizer solution 3 mL  3 mL Nebulization Q4H PRN Sital Mody, MD      . levofloxacin (LEVAQUIN) IVPB 750 mg  750 mg Intravenous Once [employer]  Charlesetta Shanks, MD      . loratadine (CLARITIN) tablet 10 mg  10 mg Oral Daily Idelle Crouch, MD   10 mg at 12/14/15 [id number]  . memantine (NAMENDA XR) 24 hr capsule 28 mg  28 mg Oral Daily Idelle Crouch, MD   28 mg at 12/14/15 0928  . metoprolol tartrate (LOPRESSOR) tablet 25 mg  25 mg Oral BID Idelle Crouch, MD   25 mg at 12/14/15 [id number]  . morphine 2 MG/ML injection 2 mg  2 mg Intravenous Q2H PRN Idelle Crouch, MD      . mupirocin ointment (BACTROBAN) 2 % 1 application  1 application Nasal BID Bettey Costa, MD   1 application at [id number]  . ondansetron (ZOFRAN) tablet 4 mg  4 mg Oral Q6H PRN Idelle Crouch, MD       Or  . ondansetron [facility]) injection 4 mg  4 mg Intravenous Q6H PRN Idelle Crouch, MD      . pantoprazole (PROTONIX) injection 40 mg  40 mg Intravenous Q12H  Idelle Crouch, MD   40 mg at 12/14/15 0936  . piperacillin-tazobactam (ZOSYN) IVPB 3.375 g  3.375 g Intravenous 3 times per day Idelle Crouch, MD   3.375 g at 12/14/15 0516  . QUEtiapine (SEROQUEL) tablet 200 mg  200 mg Oral BID PRN Idelle Crouch, MD      . sertraline (ZOLOFT) tablet 100 mg  100 mg Oral Daily Idelle Crouch, MD   100 mg at 12/14/15 [id number]  . traZODone (DESYREL) tablet 50 mg  50 mg Oral QHS Idelle Crouch, MD   50 mg at 12/13/15 2125  . vancomycin (VANCOCIN) IVPB 1000 mg/200 mL premix  1,000 mg Intravenous Q24H Idelle Crouch, MD   1,000 mg at 12/14/15 0141  . zinc oxide 20 % ointment   Topical PRN Idelle Crouch, MD         Discharge Medications: Please see discharge summary for a list of discharge medications.  Current Discharge Medication List    START taking these medications   Details  levofloxacin (LEVAQUIN) 750 MG tablet Take 1 tablet (750 mg total) by mouth daily. Qty: 5 tablet, Refills: 0      CONTINUE these medications which have NOT CHANGED   Details  cetirizine (ZYRTEC) 10 MG tablet Take 10 mg by mouth daily as needed for allergies.    donepezil (ARICEPT) 10 MG tablet Take 10 mg by mouth at bedtime.    feeding supplement, ENSURE ENLIVE, (ENSURE ENLIVE) LIQD Take 237 mLs by mouth 3 (three) times daily with meals. Qty: 237 mL, Refills: 12    fluticasone (FLONASE) 50 MCG/ACT nasal spray Place 1 spray into both nostrils daily.    memantine (NAMENDA XR) 28 MG CP24 24 hr capsule Take 28 mg by mouth daily.    Menthol-Zinc Oxide (RISAMINE) 0.44-20.625 % OINT Apply 1 application topically as needed (for irritation).     metoprolol tartrate (LOPRESSOR) 25 MG tablet Take 25 mg by mouth 2 (two) times daily.    pantoprazole (PROTONIX) 40 MG tablet Take 1 tablet (40 mg total) by mouth daily. Qty: 30 tablet, Refills: 0    pravastatin (PRAVACHOL) 40 MG tablet Take 1 tablet (40 mg total) by mouth daily at 6  PM. Qty: 30 tablet, Refills: 0    QUEtiapine (SEROQUEL) 200 MG tablet Take 200 mg by mouth 2 (two) times daily as needed (for agitation).    sertraline (ZOLOFT) 100  MG tablet Take 100 mg by mouth daily.    traZODone (DESYREL) 50 MG tablet Take 50 mg by mouth at bedtime.    zinc oxide 20 % ointment Apply topically as needed for irritation. Qty: 56.7 g, Refills: 0    aspirin EC 81 MG EC tablet Take 1 tablet (81 mg total) by mouth daily. Qty: 30 tablet, Refills: 0    docusate sodium (COLACE) 100 MG capsule Take 1 capsule (100 mg total) by mouth 2 (two) times daily. Qty: 10 capsule, Refills: 0         Relevant Imaging Results:  Relevant Lab Results:   Additional Information    Darden Dates, LCSW

## 2015-12-14 NOTE — Progress Notes (Signed)
-  Report given to [facility] to care for pt rest of shift.  -Pt tolerated DYS 2 diet. Ate cup of applesauce well w/ meds crushed at bedtime.  -VSS, NIH 14 although pt cannot follow commands at all times.

## 2015-12-14 NOTE — NC FL2 (Deleted)
[place] LEVEL OF CARE SCREENING TOOL     IDENTIFICATION  Patient Name: [name] Birthdate: 19-Nov-1928 Sex: female Admission Date (Current Location): 12/12/2015  [facility] and [place] Number:  Engineering geologist and Address:  [facility], [address], [place], [place] [address]      Provider Number: [id number]  Attending Physician Name and Address:  Bettey Costa, MD  Relative Name and Phone Number:       Current Level of Care: Hospital Recommended Level of Care: Memory Care Prior Approval Number:    Date Approved/Denied:   PASRR Number:    Discharge Plan: Domiciliary (Rest home)    Current Diagnoses: Patient Active Problem List   Diagnosis Date Noted  . Vomiting alone   . GI bleed 12/12/2015  . Anemia associated with acute blood loss 12/12/2015  . Persistent pneumonia 12/12/2015  . Fall 11/23/2015  . CVA (cerebral infarction) 11/08/2015  . Encephalopathy 11/07/2015    Orientation RESPIRATION BLADDER Height & Weight    Self  Normal Incontinent 5\' 6"  (167.6 cm) 130 lbs.  BEHAVIORAL SYMPTOMS/MOOD NEUROLOGICAL BOWEL NUTRITION STATUS      Incontinent Mechanical Soft  AMBULATORY STATUS COMMUNICATION OF NEEDS Skin   Limited Assist Verbally Normal                       Personal Care Assistance Level of Assistance  Bathing, Feeding, Dressing Bathing Assistance: Limited assistance Feeding assistance: Limited assistance Dressing Assistance: Limited assistance     Functional Limitations Info  Sight, Hearing, Speech Sight Info: Impaired Hearing Info: Impaired Speech Info: Impaired    SPECIAL CARE FACTORS FREQUENCY                       Contractures      Additional Factors Info  Code Status, Allergies Code Status Info: DNR Allergies Info: Sulfa Antibiotics           Current Medications (12/14/2015):  This is the current hospital active medication list Current Facility-Administered Medications   Medication Dose Route Frequency Provider Last Rate Last Dose  . 0.9 % NaCl with KCl 40 mEq / L  infusion   Intravenous Continuous Idelle Crouch, MD 100 mL/hr at 12/13/15 2126 100 mL/hr at 12/13/15 2126  . acetaminophen (TYLENOL) tablet 650 mg  650 mg Oral Q6H PRN Idelle Crouch, MD       Or  . acetaminophen (TYLENOL) suppository 650 mg  650 mg Rectal Q6H PRN Idelle Crouch, MD      . bisacodyl (DULCOLAX) suppository 10 mg  10 mg Rectal Daily PRN Idelle Crouch, MD      . Chlorhexidine Gluconate Cloth 2 % PADS 6 each  6 each Topical Q0600 Bettey Costa, MD   6 each at 12/14/15 0600  . docusate sodium (COLACE) capsule 100 mg  100 mg Oral BID Idelle Crouch, MD   100 mg at 12/14/15 0929  . donepezil (ARICEPT) tablet 10 mg  10 mg Oral QHS Idelle Crouch, MD   10 mg at 12/13/15 2125  . fluticasone (FLONASE) 50 MCG/ACT nasal spray 1 spray  1 spray Each Nare Daily Idelle Crouch, MD   1 spray at 12/14/15 0932  . ipratropium-albuterol (DUONEB) 0.5-2.5 (3) MG/3ML nebulizer solution 3 mL  3 mL Nebulization Q4H PRN Sital Mody, MD      . levofloxacin (LEVAQUIN) IVPB 750 mg  750 mg Intravenous Once [employer] [facility]  Edd Fabian, MD      . loratadine (CLARITIN) tablet 10 mg  10 mg Oral Daily Idelle Crouch, MD   10 mg at 12/14/15 [id number]  . memantine (NAMENDA XR) 24 hr capsule 28 mg  28 mg Oral Daily Idelle Crouch, MD   28 mg at 12/14/15 0928  . metoprolol tartrate (LOPRESSOR) tablet 25 mg  25 mg Oral BID Idelle Crouch, MD   25 mg at 12/14/15 [id number]  . morphine 2 MG/ML injection 2 mg  2 mg Intravenous Q2H PRN Idelle Crouch, MD      . mupirocin ointment (BACTROBAN) 2 % 1 application  1 application Nasal BID Bettey Costa, MD   1 application at [id number]  . ondansetron (ZOFRAN) tablet 4 mg  4 mg Oral Q6H PRN Idelle Crouch, MD       Or  . ondansetron [facility]) injection 4 mg  4 mg Intravenous Q6H PRN Idelle Crouch, MD      . pantoprazole (PROTONIX) injection 40 mg  40 mg Intravenous Q12H Idelle Crouch, MD   40 mg at 12/14/15 0936  . piperacillin-tazobactam (ZOSYN) IVPB 3.375 g  3.375 g Intravenous 3 times per day Idelle Crouch, MD   3.375 g at 12/14/15 0516  . QUEtiapine (SEROQUEL) tablet 200 mg  200 mg Oral BID PRN Idelle Crouch, MD      . sertraline (ZOLOFT) tablet 100 mg  100 mg Oral Daily Idelle Crouch, MD   100 mg at 12/14/15 [id number]  . traZODone (DESYREL) tablet 50 mg  50 mg Oral QHS Idelle Crouch, MD   50 mg at 12/13/15 2125  . vancomycin (VANCOCIN) IVPB 1000 mg/200 mL premix  1,000 mg Intravenous Q24H Idelle Crouch, MD   1,000 mg at 12/14/15 0141  . zinc oxide 20 % ointment   Topical PRN Idelle Crouch, MD         Discharge Medications: Please see discharge summary for a list of discharge medications.  Current Discharge Medication List    START taking these medications   Details  levofloxacin (LEVAQUIN) 750 MG tablet Take 1 tablet (750 mg total) by mouth daily. Qty: 5 tablet, Refills: 0 stop date on 12/19/2015       CONTINUE these medications which have NOT CHANGED   Details  cetirizine (ZYRTEC) 10 MG tablet Take 10 mg by mouth daily as needed for allergies.    donepezil (ARICEPT) 10 MG tablet Take 10 mg by mouth at bedtime.    feeding supplement, ENSURE ENLIVE, (ENSURE ENLIVE) LIQD Take 237 mLs by mouth 3 (three) times daily with meals. Qty: 237 mL, Refills: 12    fluticasone (FLONASE) 50 MCG/ACT nasal spray Place 1 spray into both nostrils daily.    memantine (NAMENDA XR) 28 MG CP24 24 hr capsule Take 28 mg by mouth daily.    Menthol-Zinc Oxide (RISAMINE) 0.44-20.625 % OINT Apply 1 application topically as needed (for irritation).     metoprolol tartrate (LOPRESSOR) 25 MG tablet Take 25 mg by mouth 2 (two) times daily.    pantoprazole (PROTONIX) 40 MG tablet Take 1 tablet (40 mg total) by mouth daily. Qty: 30 tablet, Refills: 0    pravastatin (PRAVACHOL) 40 MG tablet Take 1 tablet (40 mg total) by mouth  daily at 6 PM. Qty: 30 tablet, Refills: 0    QUEtiapine (SEROQUEL) 50 MG tablet Take 50 mg by mouth daily as needed (for agitation).    sertraline (ZOLOFT)  100 MG tablet Take 100 mg by mouth daily.    traZODone (DESYREL) 50 MG tablet Take 50 mg by mouth at bedtime.    zinc oxide 20 % ointment Apply topically as needed for irritation. Qty: 56.7 g, Refills: 0    aspirin EC 81 MG EC tablet Take 1 tablet (81 mg total) by mouth daily. Qty: 30 tablet, Refills: 0    docusate sodium (COLACE) 100 MG capsule Take 1 capsule (100 mg total) by mouth 2 (two) times daily. Qty: 10 capsule, Refills: 0             Relevant Imaging Results:  Relevant Lab Results:   Additional Information    Darden Dates, LCSW

## 2015-12-14 NOTE — Care Management Important Message (Signed)
Important Message  Patient Details  Name: [name] MRN: [id number] Date of Birth: 1928-09-08   Medicare Important Message Given:  Yes    Shelbie Ammons, RN 12/14/2015, 12:06 PM

## 2015-12-14 NOTE — Clinical Social Work Note (Signed)
Clinical Social Work Assessment  Patient Details  Name: [name] MRN: [id number] Date of Birth: [birth year]  Date of referral:  12/14/15               Reason for consult:  Facility Placement                Permission sought to share information with:  Family Supports Permission granted to share information::  Yes, Verbal Permission Granted  Name::     Helene Kelp (daughter)   Housing/Transportation Living arrangements for the past 2 months:  [place] (memory care unit) Source of Information:  Adult Children Patient Interpreter Needed:  None Criminal Activity/Legal Involvement Pertinent to Current Situation/Hospitalization:  No - Comment as needed Significant Relationships:  Adult Children Lives with:  Facility Resident Do you feel safe going back to the place where you live?  Yes Need for family participation in patient care:  Yes (Comment)  Care giving concerns:  No care giving concerns identified.   Social Worker assessment / plan:  CSW spoke with pt's daughter, Helene Kelp, to address consult. CSW introduced herself and explained role of social work. Pt's daughter was agreeable to pt returning at discharge. Pt was admitted from [facility] Facility is able to accept pt at discharge. CSW prepared discharge information.Today's discharge has been cancelled, per MD. CSW updated facility. CSW will continue to follow.   Employment status:  Retired Nurse, adult PT Recommendations:  Not assessed at this time Information / Referral to community resources:  Other (Comment Required)  Patient/Family's Response to care:  Pt's daughter is appreciative of CSW support.   Patient/Family's Understanding of and Emotional Response to Diagnosis, Current Treatment, and Prognosis:  Pt's daughter understands that pt needs to return to facility.   Emotional Assessment Appearance:  Appears stated age Attitude/Demeanor/Rapport:  Unable to  Assess Affect (typically observed):  Unable to Assess Orientation:  Oriented to Self, Fluctuating Orientation (Suspected and/or reported Sundowners) Alcohol / Substance use:  Never Used Psych involvement (Current and /or in the community):  No (Comment)  Discharge Needs  Concerns to be addressed:  Adjustment to Illness Readmission within the last 30 days:  Yes Current discharge risk:  None Barriers to Discharge:  Continued Medical Work up   Darden Dates, LCSW 12/14/2015, 3:44 PM

## 2015-12-14 NOTE — Plan of Care (Signed)
Problem: Education: Goal: Knowledge of disease or condition will improve Outcome: Not Progressing Unable to educatet pt. Pt is alert to self only.  Educated pt's daughter about stroke symptoms, prevention of stroke and complications due to immobility. Stroke book given and explained to pt's daughter. Pt' daughter verbalized understanding.  Problem: Nutrition: Goal: Risk of aspiration will decrease Outcome: Progressing Poor appetite. Now on dysphagia diet. Encouraged pt to eat.  Problem: Tissue Perfusion: Goal: Cerebral tissue perfusion will improve Outcome: Progressing VSS. Pt had an MRI of the head today. Positive for CVA. Stroke orders initiated. NIH score 10. No signs of pain nor discomfort.

## 2015-12-14 NOTE — Progress Notes (Signed)
MRI shows acute CVA. Unknown when this occurred. May have been prior to admission leading the patient to have weakness or today.Family was concerned that she was having slurred speech before coming to hospital.  Discussed with patient's daughter about use of antiplatelet agent. This is third CVA and due to GIB has not been on Anti platlet regimen. We can start low dose ASA and monitor for bleeding.  She has had ECHO and dopplers recently. DOPPLERS did not show sig stenosis adn ECHO with nml EF does not look like it was not a bubble study. I have paged NEUROLOGY and started ASA, statin, neurochecks, and PT/speech consult.

## 2015-12-14 NOTE — Evaluation (Signed)
Clinical/Bedside Swallow Evaluation Patient Details  Name: [name] MRN: [id number] Date of Birth: [birth year]  Today's Date: 12/14/2015 Time: SLP Start Time (ACUTE ONLY): 1300 SLP Stop Time (ACUTE ONLY): 1400 SLP Time Calculation (min) (ACUTE ONLY): 60 min  Past Medical History:  Past Medical History  Diagnosis Date  . Cancer ([place])   . Heart murmur   . Breast cancer ([place])     h/o  . CVA (cerebral infarction)   . GI bleed   . PNA (pneumonia)   . Dementia    Past Surgical History:  Past Surgical History  Procedure Laterality Date  . Mastectomy  Rt side    1998  . Bladder tack    . Rotator cuff repair Right    HPI:  Pt is a 82 y.o. female has a past medical history significant for CVA, Dementia, recent possible pneumonia, and recent GI bleed(presumably jeujunal) now with recurrent N/V with dark blood per rectum. Hgb=7.9 in ER. Patient is extremely weak and is now admitted w/ a change in mental status as well. Still c/o SOB and cough. Denies abdominal pain. Pt also had significant vomiting episodes at admission per Dtr's report which could have increased her risk for an aspiration episode. Dtr denies any overt s/s of dysphagia stating her Mother has been eating and drinking "well until she got sick". Dtr denies pt has had any trouble drinking the thin liquids currently(pt is on a clear liquid diet per GI/MD). She has been sleeping "a lot" per Dtr. Suspect pt's baseline Dementia could be impacting this presentation as well.    Assessment / Plan / Recommendation Clinical Impression  This was a limited assessment sec. to pt's decreased level of participation and the fact that pt required max encouragement from SLP and Dtr. to take these trials(pt tended to want to turn over and sleep instead). When pt orally accepted a bolus trial, no overt oral phase deficits noted; no overt s/s of aspiration were noted. Pt cleared b/t trials. She required feeding support. Due to the (limited)  assessment, and Dtr's report of good toleration of po's w/out dyspahgia, rec. a dys. 2 diet w/ thin liquids w/ aspiration precautions and meds in Puree d/t Cognitive status at this time. MD consulted and agreed w/ an upgrade in diet texture/consistency from a GI standpoint. ST will f/u w/ toleration of diet and trials to upgrade as pt tolerates.      Aspiration Risk   (reduced-mild risk)    Diet Recommendation  Dysphagia 2 w/ thin liquids; aspiration precautions; feeding support and encouragement - monitoring at meals  Medication Administration: Whole meds with puree    Other  Recommendations Recommended Consults:  (Dietician) Oral Care Recommendations: Oral care BID;Staff/trained caregiver to provide oral care   Follow up Recommendations  Skilled Nursing facility (TBD)    Frequency and Duration min 2x/week  1 week       Prognosis Prognosis for Safe Diet Advancement:  (fair-good) Barriers to Reach Goals: Cognitive deficits;Behavior Barriers/Prognosis Comment: decreased participation at this time      Swallow Study   General Date of Onset: 12/12/15 HPI: Pt is a 82 y.o. female has a past medical history significant for CVA, Dementia, recent possible pneumonia, and recent GI bleed(presumably jeujunal) now with recurrent N/V with dark blood per rectum. Hgb=7.9 in ER. Patient is extremely weak and is now admitted w/ a change in mental status as well. Still c/o SOB and cough. Denies abdominal pain. Pt also had significant vomiting episodes  at admission per Dtr's report which could have increased her risk for an aspiration episode. Dtr denies any overt s/s of dysphagia stating her Mother has been eating and drinking "well until she got sick". Dtr denies pt has had any trouble drinking the thin liquids currently(pt is on a clear liquid diet per GI/MD). She has been sleeping "a lot" per Dtr. Suspect pt's baseline Dementia could be impacting this presentation as well.  Type of Study: Bedside  Swallow Evaluation Previous Swallow Assessment: none Diet Prior to this Study: Regular;Thin liquids Temperature Spikes Noted: No (wbc 9.0-6.9) Respiratory Status: Room air History of Recent Intubation: No Behavior/Cognition: Uncooperative;Requires cueing (awake; then turning over to sleep again) Oral Cavity Assessment:  (unable to assess ) Oral Care Completed by SLP: No (would not participate) Oral Cavity - Dentition: Adequate natural dentition Vision:  (n/a) Self-Feeding Abilities: Total assist;Needs assist Patient Positioning: Postural control adequate for testing Baseline Vocal Quality: Low vocal intensity (few verbalizations only) Volitional Cough: Cognitively unable to elicit Volitional Swallow: Unable to elicit    Oral/Motor/Sensory Function Overall Oral Motor/Sensory Function:  (unable to assess sec. to pt's Cognitive status; appeared wfl)   Ice Chips Ice chips: Not tested   Thin Liquid Thin Liquid: Within functional limits Presentation: Straw (fed; 3 trials of multiple swallows each) Other Comments: pt required max encouragement to take these trials    Nectar Thick Nectar Thick Liquid: Not tested   Honey Thick Honey Thick Liquid: Not tested   Puree Puree: Within functional limits Presentation:  (fed; 2 trials) Other Comments: pt required max encouragement to take these trials   Solid   GO    Solid: Within functional limits Presentation: Spoon (1 trial) Other Comments: pt required max encouragement to take this trial       Teresa Kenner, MS, CCC-SLP  [name] 12/14/2015,4:55 PM

## 2015-12-14 NOTE — Progress Notes (Signed)
I just spoke with daughter who is concerned that her mother is not acting normal. She states prior to her admission she was walking and feeding herself. She does have a history of a stroke just in early December. Since discharge will be held in spite of these new findings. MRI and aspirin have been ordered.

## 2015-12-14 NOTE — Plan of Care (Signed)
Problem: Bowel/Gastric: Goal: Will show no signs and symptoms of gastrointestinal bleeding Outcome: Progressing No s/s GI bleeding   Problem: Fluid Volume: Goal: Will show no signs and symptoms of excessive bleeding Outcome: Progressing IVF infusing as ordered, no blood loss noted. Hgb 9.6  Problem: Safety: Goal: Ability to remain free from injury will improve Outcome: Progressing High fall risk. Bed alarm on, hourly rounding. Safe environment provided.   Problem: Health Behavior/Discharge Planning: Goal: Ability to manage health-related needs will improve Outcome: Progressing Pt is from [facility], could possibly go back at d/c  Problem: Pain Managment: Goal: General experience of comfort will improve Outcome: Progressing No c/o pain, resting comfortably through the night   Problem: Skin Integrity: Goal: Risk for impaired skin integrity will decrease Outcome: Progressing Refuses Q2H turning- able to turn independently when pt wants too, skin care provided- incontinent.   Problem: Fluid Volume: Goal: Ability to maintain a balanced intake and output will improve Outcome: Progressing IVF infusing as order, good output noted.   Problem: Nutrition: Goal: Adequate nutrition will be maintained Outcome: Progressing Clear liquid diet- feeding assistance needed. Speech evaluation to be completed today.

## 2015-12-14 NOTE — Progress Notes (Signed)
Subjective: This patient is Not a good historianbut had been admitted with weakness and was found to have a lower G.I. Bleed. The patient denies any problems or pain that the present time. The daughters are in the room when I saw the patient today. She is not reported to have any further bleeding overnight. Her hemoglobin has been stable.   Objective: Vital signs in last 24 hours: Filed Vitals:   12/14/15 0923 12/14/15 1505 12/14/15 1707 12/14/15 1856  BP: 155/63 158/71 152/59 136/59  Pulse: 62 71 63 66  Temp:  97.9 F (36.6 C) 98.7 F (37.1 C) 98 F (36.7 C)  TempSrc:  Oral  Oral  Resp:  18 18 18   Height:      Weight:      SpO2: 94%  97% 99%   Weight change: 1 lb 3.2 oz (0.544 kg) No intake or output data in the 24 hours ending 12/14/15 2020   Exam: Heart:: Regular rate and rhythm Lungs: normal Abdomen: soft, nontender, normal bowel sounds   Lab Results: @LABTEST2 @ Micro Results: Recent Results (from the past 240 hour(s))  Blood culture (routine x 2)     Status: None (Preliminary result)   Collection Time: 12/12/15 12:52 PM  Result Value Ref Range Status   Specimen Description BLOOD RIGHT ANTECUBITAL  Final   Special Requests BOTTLES DRAWN AEROBIC AND ANAEROBIC  4CC  Final   Culture NO GROWTH 2 DAYS  Final   Report Status PENDING  Incomplete  Blood culture (routine x 2)     Status: None (Preliminary result)   Collection Time: 12/12/15  1:05 PM  Result Value Ref Range Status   Specimen Description BLOOD LEFT ANTECUBITAL  Final   Special Requests   Final    BOTTLES DRAWN AEROBIC AND ANAEROBIC  AER 5CC ANA 1CC   Culture NO GROWTH 2 DAYS  Final   Report Status PENDING  Incomplete  MRSA PCR Screening     Status: Abnormal   Collection Time: 12/12/15  6:30 PM  Result Value Ref Range Status   MRSA by PCR POSITIVE (A) NEGATIVE Final    Comment:        The GeneXpert MRSA Assay (FDA approved for NASAL specimens only), is one component of a comprehensive MRSA  colonization surveillance program. It is not intended to diagnose MRSA infection nor to guide or monitor treatment for MRSA infections. CRITICAL RESULT CALLED TO, READ BACK BY AND VERIFIED WITH: Corine Shelter AT 2224 12/12/15.PMH    Studies/Results: X-ray Chest Pa And Lateral  12/13/2015  CLINICAL DATA:  82 year old female with persistent pneumonia currently admitted for GI bleed EXAM: CHEST  2 VIEW COMPARISON:  Prior chest x-ray 12/12/2015 FINDINGS: Stable cardiac and mediastinal contours. Atherosclerotic calcifications present in the transverse aorta. Improved bibasilar airspace opacities. No pulmonary edema. Chronic bronchitic change and interstitial prominence appear similar compared to prior. No acute osseous abnormality. IMPRESSION: Improving bibasilar airspace opacities may reflect resolving atelectasis. There is still some residual patchy airspace opacity in the left lung base which could represent infiltrate/pneumonia. Aortic atherosclerosis. Electronically Signed   By: Jacqulynn Cadet M.D.   On: 12/13/2015 10:42   Mr Brain Wo Contrast  12/14/2015  CLINICAL DATA:  Patient not acting normal.  Concern for stroke. EXAM: MRI HEAD WITHOUT CONTRAST TECHNIQUE: Multiplanar, multiecho pulse sequences of the brain and surrounding structures were obtained without intravenous contrast. COMPARISON:  11/07/2015 brain MRI FINDINGS: Motion degraded study requiring fast protocol imaging. Calvarium and upper cervical spine: No focal marrow  signal abnormality. Orbits: No significant findings. Sinuses and Mastoids: Clear. Brain: Restricted diffusion throughout the left superior cerebellar artery distribution consistent with acute infarct. There are punctate bilateral occipital cortex infarcts. These small infarcts are marked on coronal and axial diffusion weighted sequences. On DWI scan, bright signal is still seen in the region of the left MCA territory infarct noted November 07, 2015. Areas of dark ADC signal in  this region are likely from motion as other sequences suggest normal evolution and size stable infarct, with volume loss best seen on sagittal T1 weighted imaging. There is no acute hemorrhage, fourth ventricular effacement, or evidence of major vessel occlusion. Chronic small-vessel disease, mild for age with ischemic gliosis thinly confluent around the lateral ventricles. Normal cerebral volume for age. IMPRESSION: 1. Acute nonhemorrhagic left superior cerebellar infarct and punctate bilateral occipital cortex infarcts. Patent fourth ventricle. 2. Prior left MCA territory infarct without hemorrhagic conversion. 3. Motion degraded study. Electronically Signed   By: Monte Fantasia M.D.   On: 12/14/2015 14:13   Medications: I have reviewed the patient's current medications. Scheduled Meds: . aspirin  81 mg Oral Daily  . atorvastatin  20 mg Oral q1800  . Chlorhexidine Gluconate Cloth  6 each Topical Q0600  . docusate sodium  100 mg Oral BID  . donepezil  10 mg Oral QHS  . fluticasone  1 spray Each Nare Daily  . levofloxacin (LEVAQUIN) IV  750 mg Intravenous Once  . loratadine  10 mg Oral Daily  . memantine  28 mg Oral Daily  . metoprolol tartrate  25 mg Oral BID  . mupirocin ointment  1 application Nasal BID  . pantoprazole (PROTONIX) IV  40 mg Intravenous Q12H  . piperacillin-tazobactam (ZOSYN)  IV  3.375 g Intravenous 3 times per day  . sertraline  100 mg Oral Daily  . traZODone  50 mg Oral QHS   Continuous Infusions: . 0.9 % NaCl with KCl 40 mEq / L 100 mL/hr (12/14/15 1754)   PRN Meds:.acetaminophen **OR** acetaminophen, bisacodyl, ipratropium-albuterol, morphine injection, ondansetron **OR** ondansetron (ZOFRAN) IV, QUEtiapine, zinc oxide   Assessment: Principal Problem:   GI bleed Active Problems:   Anemia associated with acute blood loss   Persistent pneumonia   Vomiting alone    Plan: This patient is a 82 year old woman who has a history of a CVA who now was found to have  a lower G.I. Bleed which had occurred up roughly one month ago. She has not had any further bleeding since yesterday. The patient's hemoglobin is stable. The patient's family is adamant about not pursuing any further procedures on the patient. I agree with them in light of the patient unlikely being able to drink a prep. No further G.I. Intervention at this time. Please do not hesitate to call if you have any further questions.   LOS: 2 days   Daren Allen Norris 12/14/2015, 8:20 PM

## 2015-12-14 NOTE — Consult Note (Signed)
Patient with repeat admission for LGI bleed, previous site was sigmoid and likely diverticular.  Due to poor health and profound dementia she is not a candidate for colonoscopy.  CXR showed Left basilar prominence possible pneumonia.  She tested pos for MRSA.  Abd flat, non tender, conjunctiva slightly pale. VSS afebrile, hgb 9.4 after transfusion. Will follow with you.

## 2015-12-14 NOTE — Discharge Summary (Addendum)
[place] at [place] NAME: [name]    MR#:  [id number]  DATE OF BIRTH:  1928/09/19  DATE OF ADMISSION:  12/12/2015 ADMITTING PHYSICIAN: Max Sane, MD  DATE OF DISCHARGE: 12/14/2015  PRIMARY CARE PHYSICIAN: Mathews Argyle, MD    ADMISSION DIAGNOSIS:  Weakness [R53.1] Healthcare-associated pneumonia [J18.9] Gastrointestinal hemorrhage with melena [K92.1] Vomiting without nausea, intractability of vomiting not specified, unspecified vomiting type [R11.11]  DISCHARGE DIAGNOSIS:  Principal Problem:   GI bleed Active Problems:   Anemia associated with acute blood loss   Persistent pneumonia   Vomiting alone   SECONDARY DIAGNOSIS:   Past Medical History  Diagnosis Date  . Cancer ([place])   . Heart murmur   . Breast cancer ([place])     h/o  . CVA (cerebral infarction)   . GI bleed   . PNA (pneumonia)   . Dementia     HOSPITAL COURSE:   82 year old female with a history of CVA and recent GI bleed who presents again with a GI bleed.  1. GI bleed: Patient had a recent GI bleed about a month ago and was not deemed a candidate for colonoscopy due to her inability to drink the prep and history of dementia. She had a bleeding scan at that time which did show bleeding in the sigmoid colon. Patient has been seen by GI during this hospitalization who also feels that at this point patient is not a candidate for colonoscopy. Her hemoglobin remained stable. If she were to continue to have bleeding then a GI bleeding scan to localize the site would be preferable.   2. Recent pneumonia: It appears by x-ray this may be aspiration. Speech consult for tomorrow. Continue Levaquin and Zosyn.  3. Dementia: Continue Zoloft, Seroquel, donepezil and Namenda.  DISCHARGE CONDITIONS AND DIET:  Stable condition Mechanical soft CONSULTS OBTAINED:  Treatment Team:  Lucilla Lame, MD  DRUG ALLERGIES:   Allergies  Allergen Reactions  . Sulfa  Antibiotics Hives    DISCHARGE MEDICATIONS:   Current Discharge Medication List    START taking these medications   Details  levofloxacin (LEVAQUIN) 750 MG tablet Take 1 tablet (750 mg total) by mouth daily. Qty: 5 tablet, Refills: 0  stop date on 12/19/2015       CONTINUE these medications which have NOT CHANGED   Details  cetirizine (ZYRTEC) 10 MG tablet Take 10 mg by mouth daily as needed for allergies.    donepezil (ARICEPT) 10 MG tablet Take 10 mg by mouth at bedtime.    feeding supplement, ENSURE ENLIVE, (ENSURE ENLIVE) LIQD Take 237 mLs by mouth 3 (three) times daily with meals. Qty: 237 mL, Refills: 12    fluticasone (FLONASE) 50 MCG/ACT nasal spray Place 1 spray into both nostrils daily.    memantine (NAMENDA XR) 28 MG CP24 24 hr capsule Take 28 mg by mouth daily.    Menthol-Zinc Oxide (RISAMINE) 0.44-20.625 % OINT Apply 1 application topically as needed (for irritation).     metoprolol tartrate (LOPRESSOR) 25 MG tablet Take 25 mg by mouth 2 (two) times daily.    pantoprazole (PROTONIX) 40 MG tablet Take 1 tablet (40 mg total) by mouth daily. Qty: 30 tablet, Refills: 0    pravastatin (PRAVACHOL) 40 MG tablet Take 1 tablet (40 mg total) by mouth daily at 6 PM. Qty: 30 tablet, Refills: 0    QUEtiapine (SEROQUEL) 50 MG tablet Take 50 mg by mouth  daily as needed (for agitation).  sertraline (ZOLOFT) 100 MG tablet Take 100 mg by mouth daily.    traZODone (DESYREL) 50 MG tablet Take 50 mg by mouth at bedtime.    zinc oxide 20 % ointment Apply topically as needed for irritation. Qty: 56.7 g, Refills: 0    aspirin EC 81 MG EC tablet Take 1 tablet (81 mg total) by mouth daily. Qty: 30 tablet, Refills: 0    docusate sodium (COLACE) 100 MG capsule Take 1 capsule (100 mg total) by mouth 2 (two) times daily. Qty: 10 capsule, Refills: 0              Today   CHIEF COMPLAINT:  No issues overnight no GIB    VITAL SIGNS:  Blood pressure 155/63, pulse 62,  temperature 98.4 F (36.9 C), temperature source Oral, resp. rate 18, height 5\' 6"  (1.676 m), weight 58.968 kg (130 lb), SpO2 94 %.   REVIEW OF SYSTEMS:  Review of Systems  Unable to perform ROS: dementia     PHYSICAL EXAMINATION:  GENERAL:  82 y.o.-year-old patient lying in the bed with no acute distress.  NECK:  Supple, no jugular venous distention. No thyroid enlargement, no tenderness.  LUNGS: Normal breath sounds bilaterally, no wheezing, rales,rhonchi  No use of accessory muscles of respiration.  CARDIOVASCULAR: S1, S2 normal. No murmurs, rubs, or gallops.  ABDOMEN: Soft, non-tender, non-distended. Bowel sounds present. No organomegaly or mass.  EXTREMITIES: No pedal edema, cyanosis, or clubbing.  PSYCHIATRIC: The patient is alert and oriented x name  SKIN: No obvious rash, lesion, or ulcer.   DATA REVIEW:   CBC  Recent Labs Lab 12/14/15 0447  WBC 6.9  HGB 9.4*  HCT 29.4*  PLT 124*    Chemistries   Recent Labs Lab 12/13/15 0437  NA 142  K 3.7  CL 110  CO2 24  GLUCOSE 96  BUN 7  CREATININE 0.77  CALCIUM 9.0  AST 13*  ALT 9*  ALKPHOS 57  BILITOT 1.3*    Cardiac Enzymes  Recent Labs Lab 12/12/15 0949  TROPONINI 0.05*    Microbiology Results  @[email or web address] @  RADIOLOGY:  X-ray Chest Pa And Lateral  12/13/2015  CLINICAL DATA:  82 year old female with persistent pneumonia currently admitted for GI bleed EXAM: CHEST  2 VIEW COMPARISON:  Prior chest x-ray 12/12/2015 FINDINGS: Stable cardiac and mediastinal contours. Atherosclerotic calcifications present in the transverse aorta. Improved bibasilar airspace opacities. No pulmonary edema. Chronic bronchitic change and interstitial prominence appear similar compared to prior. No acute osseous abnormality. IMPRESSION: Improving bibasilar airspace opacities may reflect resolving atelectasis. There is still some residual patchy airspace opacity in the left lung base which could represent infiltrate/pneumonia.  Aortic atherosclerosis. Electronically Signed   By: Jacqulynn Cadet M.D.   On: 12/13/2015 10:42     Stable for discharge   Patient should follow up with PCP in 1 week Left message with POA Olivia Mackie  CODE STATUS:     Code Status Orders        Start     Ordered   12/12/15 1402  Do not attempt resuscitation (DNR)   Continuous    Question Answer Comment  In the event of cardiac or respiratory ARREST Do not call a "code blue"   In the event of cardiac or respiratory ARREST Do not perform Intubation, CPR, defibrillation or ACLS   In the event of cardiac or respiratory ARREST Use medication by any route, position, wound care, and other measures to relive pain and suffering. May use oxygen,  suction and manual treatment of airway obstruction as needed for comfort.      12/12/15 1401    Advance Directive Documentation        Most Recent Value   Type of Advance Directive  Healthcare Power of Attorney, Living will   Pre-existing out of facility DNR order (yellow form or pink MOST form)     "MOST" Form in Place?        TOTAL TIME TAKING CARE OF THIS PATIENT: 35 minutes.    Note: This dictation was prepared with Dragon dictation along with smaller phrase technology. Any transcriptional errors that result from this process are unintentional.  Zenith Kercheval M.D on 12/14/2015 at 12:58 PM  Between 7am to 6pm - Pager - [phone] After 6pm go to [email or web address] - password [email or web address] [facility]  [facility] [place] [facility]  Office  [phone]  CC: Primary care physician; Mathews Argyle, MD

## 2015-12-15 ENCOUNTER — Inpatient Hospital Stay: Admit: 2015-12-15 | Payer: Medicare Other

## 2015-12-15 DIAGNOSIS — I63433 Cerebral infarction due to embolism of bilateral posterior cerebral arteries: Secondary | ICD-10-CM

## 2015-12-15 LAB — LIPID PANEL
CHOL/HDL RATIO: 4.6 ratio
CHOLESTEROL: 188 mg/dL (ref 0–200)
HDL: 41 mg/dL (ref >40)
LDL Cholesterol: 132 mg/dL — ABNORMAL HIGH (ref 0–99)
Triglycerides: 77 mg/dL (ref <150)
VLDL: 15 mg/dL (ref 0–40)

## 2015-12-15 LAB — BASIC METABOLIC PANEL
Anion gap: 2 — ABNORMAL LOW (ref 5–15)
BUN: 5 mg/dL — AB (ref 6–20)
CALCIUM: 8.6 mg/dL — AB (ref 8.9–10.3)
CO2: 25 mmol/L (ref 22–32)
CREATININE: 0.95 mg/dL (ref 0.44–1.00)
Chloride: 110 mmol/L (ref 101–111)
GFR calc Af Amer: >60 mL/min (ref >60)
GFR, EST NON AFRICAN AMERICAN: 53 mL/min — AB (ref >60)
GLUCOSE: 98 mg/dL (ref 65–99)
Potassium: 4.4 mmol/L (ref 3.5–5.1)
Sodium: 137 mmol/L (ref 135–145)

## 2015-12-15 LAB — CBC
HCT: 28 % — ABNORMAL LOW (ref 35.0–47.0)
Hemoglobin: 8.9 g/dL — ABNORMAL LOW (ref 12.0–16.0)
MCH: 25.6 pg — AB (ref 26.0–34.0)
MCHC: 32 g/dL (ref 32.0–36.0)
MCV: 80 fL (ref 80.0–100.0)
PLATELETS: 125 10*3/uL — AB (ref 150–440)
RBC: 3.5 MIL/uL — ABNORMAL LOW (ref 3.80–5.20)
RDW: 15.6 % — AB (ref 11.5–14.5)
WBC: 6.1 10*3/uL (ref 3.6–11.0)

## 2015-12-15 MED ORDER — SODIUM CHLORIDE 0.9 % IV SOLN
1.5000 g | Freq: Four times a day (QID) | INTRAVENOUS | Status: DC
  Administered 2015-12-15 – 2015-12-17 (×7): 1.5 g via INTRAVENOUS
  Filled 2015-12-15 (×13): qty 1.5

## 2015-12-15 NOTE — Progress Notes (Signed)
[name] at [place] NAME: [name]    MR#:  [id number]  DATE OF BIRTH:  1928-07-20  SUBJECTIVE:   Daughters at bedside. Patient is in a fetal position on her bed. She answers questions but does not open her eyes.  REVIEW OF SYSTEMS:    Review of Systems  Constitutional: Negative for fever, chills and malaise/fatigue.  HENT: Negative for sore throat.   Eyes: Negative for blurred vision.  Respiratory: Negative for cough, hemoptysis, shortness of breath and wheezing.   Cardiovascular: Negative for chest pain, palpitations and leg swelling.  Gastrointestinal: Negative for nausea, vomiting, abdominal pain, diarrhea and blood in stool.  Genitourinary: Negative for dysuria.  Musculoskeletal: Negative for back pain.  Neurological: Negative for dizziness, tremors and headaches.  Endo/Heme/Allergies: Does not bruise/bleed easily.    Tolerating Diet: Yes     DRUG ALLERGIES:   Allergies  Allergen Reactions  . Sulfa Antibiotics Hives    VITALS:  Blood pressure 129/94, pulse 71, temperature 98 F (36.7 C), temperature source Oral, resp. rate 18, height 5\' 6"  (1.676 m), weight 58.968 kg (130 lb), SpO2 100 %.  PHYSICAL EXAMINATION:   Physical Exam  Constitutional: She is well-developed, well-nourished, and in no distress. No distress.  HENT:  Head: Normocephalic.  Eyes: No scleral icterus.  Neck: Normal range of motion. No JVD present. No tracheal deviation present.  Cardiovascular: Normal rate and regular rhythm.  Exam reveals no gallop and no friction rub.   Murmur heard. Pulmonary/Chest: Effort normal and breath sounds normal. No respiratory distress. She has no wheezes. She has no rales. She exhibits no tenderness.  Abdominal: Soft. Bowel sounds are normal. She exhibits no distension and no mass. There is no tenderness. There is no rebound and no guarding.  Musculoskeletal: Normal range of motion. She exhibits no edema.   Neurological:  Sleepy but answers questions.  Skin: Skin is warm. No rash noted. No erythema.      LABORATORY PANEL:   CBC  Recent Labs Lab 12/15/15 0607  WBC 6.1  HGB 8.9*  HCT 28.0*  PLT 125*   ------------------------------------------------------------------------------------------------------------------  Chemistries   Recent Labs Lab 12/13/15 0437 12/15/15 0607  NA 142 137  K 3.7 4.4  CL 110 110  CO2 24 25  GLUCOSE 96 98  BUN 7 5*  CREATININE 0.77 0.95  CALCIUM 9.0 8.6*  AST 13*  --   ALT 9*  --   ALKPHOS 57  --   BILITOT 1.3*  --    ------------------------------------------------------------------------------------------------------------------  Cardiac Enzymes  Recent Labs Lab 12/12/15 0949  TROPONINI 0.05*   ------------------------------------------------------------------------------------------------------------------  RADIOLOGY:  Mr Brain Wo Contrast  12/14/2015  CLINICAL DATA:  Patient not acting normal.  Concern for stroke. EXAM: MRI HEAD WITHOUT CONTRAST TECHNIQUE: Multiplanar, multiecho pulse sequences of the brain and surrounding structures were obtained without intravenous contrast. COMPARISON:  11/07/2015 brain MRI FINDINGS: Motion degraded study requiring fast protocol imaging. Calvarium and upper cervical spine: No focal marrow signal abnormality. Orbits: No significant findings. Sinuses and Mastoids: Clear. Brain: Restricted diffusion throughout the left superior cerebellar artery distribution consistent with acute infarct. There are punctate bilateral occipital cortex infarcts. These small infarcts are marked on coronal and axial diffusion weighted sequences. On DWI scan, bright signal is still seen in the region of the left MCA territory infarct noted November 07, 2015. Areas of dark ADC signal in this region are likely from motion as other sequences suggest normal evolution and size  stable infarct, with volume loss best seen on sagittal  T1 weighted imaging. There is no acute hemorrhage, fourth ventricular effacement, or evidence of major vessel occlusion. Chronic small-vessel disease, mild for age with ischemic gliosis thinly confluent around the lateral ventricles. Normal cerebral volume for age. IMPRESSION: 1. Acute nonhemorrhagic left superior cerebellar infarct and punctate bilateral occipital cortex infarcts. Patent fourth ventricle. 2. Prior left MCA territory infarct without hemorrhagic conversion. 3. Motion degraded study. Electronically Signed   By: Monte Fantasia M.D.   On: 12/14/2015 14:13     ASSESSMENT AND PLAN:   82 year old female with a history of CVA and recent GI bleed who presents again with a GI bleed and weakness and now found to have an acute nonhemorrhagic stroke.  1. Acute nonhemorrhagic left. Cerebellar infarct and punctate bilateral occipital cortex infarcts: This is likely embolic in nature. Due to the patient's dementia and GI bleed a TEE would not be of benefit as she will not be able to tolerate the procedure and cannot take full dose anticoagulation due to a cold GI bleed. She is on a baby aspirin and statin. Her hemoglobin is being monitored carefully. I discussed side effects, benefits and risks with the patient's family who is in agreement to start a baby aspirin. Neuro checks every 4 hours. Neurology consultation has been placed. Patient does not need a Doppler as this was done early in December which showed no evidence of stenosis. She also had a 2-D echocardiogram in early December which shows a normal ejection fraction with normal right heart pressures and therefore does not need a bubble echo..  2.. GI bleed: Patient had a recent GI bleed about a month ago and was not deemed a candidate for colonoscopy due to her inability to drink the prep and history of dementia. She had a bleeding scan at that time which did show bleeding in the sigmoid colon. Patient has been seen by GI during this  hospitalization who also feels that at this point patient is not a candidate for colonoscopy. Her hemoglobin remained stable. She is on aspirin due to problem #1 and her hemoglobin is being monitored. Her hemoglobin is slightly decreased but there is no evidence of GI blood loss.   3 Recent pneumonia: It appears by x-ray this may be aspiration. Speech evaluation recommends dysphagia 2 with thin liquids and aspiration precautions. I will change antibiotics to Unasyn. 4. Dementia: Continue Zoloft, Seroquel, donepezil and Namenda.     CODE STATUS: DNR  TOTAL TIME TAKING CARE OF THIS PATIENT: 45 minutes.   Discussed with family and Dr. Doy Mince POSSIBLE D/C tomorrow, DEPENDING ON CLINICAL CONDITION.    Briggette Najarian M.D on 12/15/2015 at 10:24 AM  Between 7am to 6pm - Pager - [phone] After 6pm go to [email or web address] - password [email or web address] [place] [facility]  Office  [phone]  CC: Primary care physician; Mathews Argyle, MD  Note: This dictation was prepared with Dragon dictation along with smaller phrase technology. Any transcriptional errors that result from this process are unintentional.

## 2015-12-15 NOTE — Progress Notes (Signed)
ANTIBIOTIC CONSULT NOTE - INITIAL  Pharmacy Consult for Unasyn Indication: Aspiration PNA  Allergies  Allergen Reactions  . Sulfa Antibiotics Hives    Patient Measurements: Height: 5\' 6"  (167.6 cm) Weight: 130 lb (58.968 kg) IBW/kg (Calculated) : 59.3  Vital Signs: Temp: 98 F (36.7 C) (01/10 0915) Temp Source: Oral (01/10 0915) BP: 129/94 mmHg (01/10 0915) Pulse Rate: 71 (01/10 0915) Intake/Output from previous day:   Intake/Output from this shift:    Labs:  Recent Labs  12/13/15 0437 12/14/15 0447 12/15/15 0607  WBC 7.1 6.9 6.1  HGB 9.6* 9.4* 8.9*  PLT 136* 124* 125*  CREATININE 0.77  --  0.95   Estimated Creatinine Clearance: 40.3 mL/min (by C-G formula based on Cr of 0.95). No results for input(s): VANCOTROUGH, VANCOPEAK, VANCORANDOM, GENTTROUGH, GENTPEAK, GENTRANDOM, TOBRATROUGH, TOBRAPEAK, TOBRARND, AMIKACINPEAK, AMIKACINTROU, AMIKACIN in the last 72 hours.   Microbiology: Recent Results (from the past 720 hour(s))  Blood culture (routine x 2)     Status: None (Preliminary result)   Collection Time: 12/12/15 12:52 PM  Result Value Ref Range Status   Specimen Description BLOOD RIGHT ANTECUBITAL  Final   Special Requests BOTTLES DRAWN AEROBIC AND ANAEROBIC  4CC  Final   Culture NO GROWTH 3 DAYS  Final   Report Status PENDING  Incomplete  Blood culture (routine x 2)     Status: None (Preliminary result)   Collection Time: 12/12/15  1:05 PM  Result Value Ref Range Status   Specimen Description BLOOD LEFT ANTECUBITAL  Final   Special Requests   Final    BOTTLES DRAWN AEROBIC AND ANAEROBIC  AER 5CC ANA 1CC   Culture NO GROWTH 3 DAYS  Final   Report Status PENDING  Incomplete  MRSA PCR Screening     Status: Abnormal   Collection Time: 12/12/15  6:30 PM  Result Value Ref Range Status   MRSA by PCR POSITIVE (A) NEGATIVE Final    Comment:        The GeneXpert MRSA Assay (FDA approved for NASAL specimens only), is one component of a comprehensive MRSA  colonization surveillance program. It is not intended to diagnose MRSA infection nor to guide or monitor treatment for MRSA infections. CRITICAL RESULT CALLED TO, READ BACK BY AND VERIFIED WITH: Corine Shelter AT 2224 12/12/15.PMH     Medical History: Past Medical History  Diagnosis Date  . Cancer ([place])   . Heart murmur   . Breast cancer ([place])     h/o  . CVA (cerebral infarction)   . GI bleed   . PNA (pneumonia)   . Dementia     Medications:  Scheduled:  . ampicillin-sulbactam (UNASYN) IV  1.5 g Intravenous Q6H  . aspirin  81 mg Oral Daily  . atorvastatin  20 mg Oral q1800  . Chlorhexidine Gluconate Cloth  6 each Topical Q0600  . docusate sodium  100 mg Oral BID  . donepezil  10 mg Oral QHS  . fluticasone  1 spray Each Nare Daily  . loratadine  10 mg Oral Daily  . memantine  28 mg Oral Daily  . metoprolol tartrate  25 mg Oral BID  . mupirocin ointment  1 application Nasal BID  . pantoprazole (PROTONIX) IV  40 mg Intravenous Q12H  . sertraline  100 mg Oral Daily  . traZODone  50 mg Oral QHS   Infusions:  . 0.9 % NaCl with KCl 40 mEq / L 100 mL/hr (12/15/15 0908)   Assessment: 82 y/o F on Zosyn  for aspiration PNA to deescalate to Unasyn.   Plan:  Will order Unasyn 1.5 g iv q 6 hours beginning 8 hours after last Zosyn dose.   Will continue to follow renal function and culture results.   Ulice Dash D 12/15/2015,10:34 AM

## 2015-12-15 NOTE — Progress Notes (Signed)
ANTIBIOTIC CONSULT NOTE - INITIAL  Pharmacy Consult for Vancomycin and Zosyn  Indication: pneumonia  Allergies  Allergen Reactions  . Sulfa Antibiotics Hives    Patient Measurements: Height: 5\' 6"  (167.6 cm) Weight: 130 lb (58.968 kg) IBW/kg (Calculated) : 59.3  Vital Signs: Temp: 98.4 F (36.9 C) (01/10 0500) Temp Source: Oral (01/10 0500) BP: 131/64 mmHg (01/10 0500) Pulse Rate: 69 (01/10 0500) :  Recent Labs  12/12/15 0949 12/13/15 0437 12/14/15 0447 12/15/15 0607  WBC 9.0 7.1 6.9 6.1  HGB 7.9* 9.6* 9.4* 8.9*  PLT 157 136* 124* 125*  CREATININE 0.83 0.77  --  0.95   Estimated Creatinine Clearance: 40.3 mL/min (by C-G formula based on Cr of 0.95). No results for input(s): VANCOTROUGH, VANCOPEAK, VANCORANDOM, GENTTROUGH, GENTPEAK, GENTRANDOM, TOBRATROUGH, TOBRAPEAK, TOBRARND, AMIKACINPEAK, AMIKACINTROU, AMIKACIN in the last 72 hours.   Microbiology: Recent Results (from the past 720 hour(s))  Blood culture (routine x 2)     Status: None (Preliminary result)   Collection Time: 12/12/15 12:52 PM  Result Value Ref Range Status   Specimen Description BLOOD RIGHT ANTECUBITAL  Final   Special Requests BOTTLES DRAWN AEROBIC AND ANAEROBIC  4CC  Final   Culture NO GROWTH 3 DAYS  Final   Report Status PENDING  Incomplete  Blood culture (routine x 2)     Status: None (Preliminary result)   Collection Time: 12/12/15  1:05 PM  Result Value Ref Range Status   Specimen Description BLOOD LEFT ANTECUBITAL  Final   Special Requests   Final    BOTTLES DRAWN AEROBIC AND ANAEROBIC  AER 5CC ANA 1CC   Culture NO GROWTH 3 DAYS  Final   Report Status PENDING  Incomplete  MRSA PCR Screening     Status: Abnormal   Collection Time: 12/12/15  6:30 PM  Result Value Ref Range Status   MRSA by PCR POSITIVE (A) NEGATIVE Final    Comment:        The GeneXpert MRSA Assay (FDA approved for NASAL specimens only), is one component of a comprehensive MRSA colonization surveillance program.  It is not intended to diagnose MRSA infection nor to guide or monitor treatment for MRSA infections. CRITICAL RESULT CALLED TO, READ BACK BY AND VERIFIED WITH: Corine Shelter AT 2224 12/12/15.PMH     Medical History: Past Medical History  Diagnosis Date  . Cancer ([name])   . Heart murmur   . Breast cancer ([place])     h/o  . CVA (cerebral infarction)   . GI bleed   . PNA (pneumonia)   . Dementia    Assessment: 82 yo female with recent pneumonia and GI bleed admitted with recurrent N/V and dark blood per rectum. Pharmacy consulted for vancomycin and Zosyn dosing and monitoring.  Vancomycin d/c 1/9.   Plan:  Will continue patient on Zosyn 3.375g IV EI q8h.  Pharmacy will continue to monitor labs and renal function and make adjustments as needed.    Ulice Dash, PharmD Clinical Pharmacist   12/15/2015,9:10 AM

## 2015-12-15 NOTE — Evaluation (Signed)
Physical Therapy Evaluation Patient Details Name: [name] MRN: [id number] DOB: 1928-06-11 Today's Date: 12/15/2015   History of Present Illness  [name] is a 82 y.o. female has a past medical history significant for CVA, recent pneumonia, and recent GI bleed(presumably jeujunal) now with recurrent N/V with dark blood per rectum. Hgb=7.9 in ER. Patient is extremely weak and is now admitted. Still c/o SOB and cough  Clinical Impression  Patient is lying in bed awake with eyes closed and agrees to evaluation. She requires max assist for bed mobility and is able to raise her legs from the edge of the bed back to the bed. She reports that her shoulders hurt during supine to sit bed mobility. She requires max assist x 2 for all bed mobility and is lethargic and keeps her eyes closed. She has weakness in BLE and BUE but unable to formally test due to poor level of participation. Patient will be recommended SNF placement until she is able to perform her prior level of mobility of ambulation with RW per daughter.     Follow Up Recommendations SNF    Equipment Recommendations       Recommendations for Other Services       Precautions / Restrictions Restrictions Weight Bearing Restrictions: No      Mobility  Bed Mobility Overal bed mobility: + 2 for safety/equipment;+2 for physical assistance             General bed mobility comments: unable to perform supine to sit due to patient putting her LE's back into the bed after patient had been assisted with LE off the bed.  Transfers Overall transfer level:  (nt due to patients level of participation)                  Ambulation/Gait                Stairs            Wheelchair Mobility    Modified Rankin (Stroke Patients Only)       Balance                                             Pertinent Vitals/Pain Pain Assessment: No/denies pain    Home Living Family/patient expects  to be discharged to:: Assisted living               Home Equipment: Walker - 2 wheels Additional Comments: was at [employer] with her husband prior to admission    Prior Function Level of Independence: Independent with assistive device(s)               Hand Dominance   Dominant Hand: Left    Extremity/Trunk Assessment   Upper Extremity Assessment: Generalized weakness           Lower Extremity Assessment: Generalized weakness         Communication   Communication: Expressive difficulties  Cognition Arousal/Alertness: Lethargic Behavior During Therapy:  (eyes closed uncooperative) Overall Cognitive Status: Difficult to assess                      General Comments      Exercises        Assessment/Plan    PT Assessment Patient needs continued PT services  PT Diagnosis Generalized weakness   PT Problem List  Decreased strength;Decreased balance;Decreased safety awareness;Decreased activity tolerance  PT Treatment Interventions Therapeutic activities;Therapeutic exercise;Gait training   PT Goals (Current goals can be found in the Care Plan section) Acute Rehab PT Goals Patient Stated Goal: none stated PT Goal Formulation: Patient unable to participate in goal setting Time For Goal Achievement: 12/29/15 Potential to Achieve Goals: Fair    Frequency Min 2X/week   Barriers to discharge        Co-evaluation               End of Session   Activity Tolerance: Patient limited by lethargy             Time: [id number] PT Time Calculation (min) (ACUTE ONLY): 10 min   Charges:   PT Evaluation $PT Eval Low Complexity: 1 Procedure     PT G Codes:      Alanson Puls, PT, DPT  [place], Minette Headland S 12/15/2015, 10:25 AM

## 2015-12-15 NOTE — Consult Note (Signed)
Referring Physician: Mody    Chief Complaint: Vomiting, weakness  HPI: [name] is an 82 y.o. female who lives at an SNF with her husband.  Went to breakfast on Saturday and was normal.  After breakfast was noted to have slurred speech.  Patient was weak and required assistance for gait.  Patient then began to vomit.  EMS was called and patient was brought to the ED where she continued to vomit.  Patient was admitted for further work up at that time.  With improvement in medical issues patient has remain lethargic and speech slurred. MRI shows an acute infarct.    Date last known well: 12/12/2015 Time last known well: Time: 08:30 tPA Given: No: Recent infarct, recent GIB  Modified Rankin: Rankin Score=3  Past Medical History  Diagnosis Date  . Cancer ([place])   . Heart murmur   . Breast cancer ([place])     h/o  . CVA (cerebral infarction)   . GI bleed   . PNA (pneumonia)   . Dementia     Past Surgical History  Procedure Laterality Date  . Mastectomy  Rt side    1998  . Bladder tack    . Rotator cuff repair Right     Family History  Problem Relation Age of Onset  . Leukemia Sister   . CAD Father    Social History:  reports that she has never smoked. She does not have any smokeless tobacco history on file. She reports that she does not drink alcohol or use illicit drugs.  Allergies:  Allergies  Allergen Reactions  . Sulfa Antibiotics Hives    Medications:  I have reviewed the patient's current medications. Prior to Admission:  Prescriptions prior to admission  Medication Sig Dispense Refill Last Dose  . cetirizine (ZYRTEC) 10 MG tablet Take 10 mg by mouth daily as needed for allergies.   12/11/2015 at Unknown time  . donepezil (ARICEPT) 10 MG tablet Take 10 mg by mouth at bedtime.   12/11/2015 at Unknown time  . feeding supplement, ENSURE ENLIVE, (ENSURE ENLIVE) LIQD Take 237 mLs by mouth 3 (three) times daily with meals. 237 mL 12 12/12/2015 at Unknown time  . fluticasone  (FLONASE) 50 MCG/ACT nasal spray Place 1 spray into both nostrils daily.   12/12/2015 at Unknown time  . memantine (NAMENDA XR) 28 MG CP24 24 hr capsule Take 28 mg by mouth daily.   12/12/2015 at Unknown time  . Menthol-Zinc Oxide (RISAMINE) 0.44-20.625 % OINT Apply 1 application topically as needed (for irritation).    Past Month at Unknown time  . metoprolol tartrate (LOPRESSOR) 25 MG tablet Take 25 mg by mouth 2 (two) times daily.   12/12/2015 at Unknown time  . pantoprazole (PROTONIX) 40 MG tablet Take 1 tablet (40 mg total) by mouth daily. 30 tablet 0 12/12/2015 at Unknown time  . pravastatin (PRAVACHOL) 40 MG tablet Take 1 tablet (40 mg total) by mouth daily at 6 PM. 30 tablet 0 12/11/2015 at Unknown time  . sertraline (ZOLOFT) 100 MG tablet Take 100 mg by mouth daily.   12/12/2015 at Unknown time  . traZODone (DESYREL) 50 MG tablet Take 50 mg by mouth at bedtime.   12/11/2015 at Unknown time  . zinc oxide 20 % ointment Apply topically as needed for irritation. 56.7 g 0 Past Month at Unknown time  . [DISCONTINUED] QUEtiapine (SEROQUEL) 200 MG tablet Take 200 mg by mouth 2 (two) times daily as needed (for agitation).   Past  Month at Unknown time  . aspirin EC 81 MG EC tablet Take 1 tablet (81 mg total) by mouth daily. (Patient not taking: Reported on 11/23/2015) 30 tablet 0 Not Taking at Unknown time  . docusate sodium (COLACE) 100 MG capsule Take 1 capsule (100 mg total) by mouth 2 (two) times daily. (Patient not taking: Reported on 11/23/2015) 10 capsule 0 Not Taking at Unknown time   Scheduled: . ampicillin-sulbactam (UNASYN) IV  1.5 g Intravenous Q6H  . aspirin  81 mg Oral Daily  . atorvastatin  20 mg Oral q1800  . Chlorhexidine Gluconate Cloth  6 each Topical Q0600  . docusate sodium  100 mg Oral BID  . donepezil  10 mg Oral QHS  . fluticasone  1 spray Each Nare Daily  . loratadine  10 mg Oral Daily  . memantine  28 mg Oral Daily  . metoprolol tartrate  25 mg Oral BID  . mupirocin ointment  1  application Nasal BID  . pantoprazole (PROTONIX) IV  40 mg Intravenous Q12H  . sertraline  100 mg Oral Daily  . traZODone  50 mg Oral QHS    ROS: History obtained from family  General ROS: negative for - chills, fatigue, fever, night sweats, weight gain or weight loss Psychological ROS: negative for - behavioral disorder, hallucinations, memory difficulties, mood swings or suicidal ideation Ophthalmic ROS: negative for - blurry vision, double vision, eye pain or loss of vision ENT ROS: negative for - epistaxis, nasal discharge, oral lesions, sore throat, tinnitus or vertigo Allergy and Immunology ROS: negative for - hives or itchy/watery eyes Hematological and Lymphatic ROS: negative for - bleeding problems, bruising or swollen lymph nodes Endocrine ROS: negative for - galactorrhea, hair pattern changes, polydipsia/polyuria or temperature intolerance Respiratory ROS: negative for - cough, hemoptysis, shortness of breath or wheezing Cardiovascular ROS: negative for - chest pain, dyspnea on exertion, edema or irregular heartbeat Gastrointestinal ROS: as noted in HPI Genito-Urinary ROS: negative for - dysuria, hematuria, incontinence or urinary frequency/urgency Musculoskeletal ROS: negative for - joint swelling or muscular weakness Neurological ROS: as noted in HPI Dermatological ROS: negative for rash and skin lesion changes  Physical Examination: Blood pressure 129/94, pulse 71, temperature 98 F (36.7 C), temperature source Oral, resp. rate 18, height 5\' 6"  (1.676 m), weight 58.968 kg (130 lb), SpO2 100 %.  Gen: NAD HEENT-  Normocephalic, no lesions, without obvious abnormality.  Normal external eye and conjunctiva.  Normal TM's bilaterally.  Normal auditory canals and external ears. Normal external nose, mucus membranes and septum.  Normal pharynx. Cardiovascular- S1, S2 normal, pulses palpable throughout   Lungs- chest clear, no wheezing, rales, normal symmetric air entry Abdomen-  soft, non-tender; bowel sounds normal; no masses,  no organomegaly Extremities- no edema Lymph-no adenopathy palpable Musculoskeletal-no joint tenderness, deformity or swelling Skin-warm and dry, no hyperpigmentation, vitiligo, or suspicious lesions  Neurological Examination Mental Status: Lethargic.  Responds little.  No spontaneous speech.  Oriented to name only.   Able to start a sentence fluently but becomes aphasic as she continues.  Follows simple commands.   Cranial Nerves: II: Discs flat bilaterally; Blinks to bilateral confrontation.  Pupils equal, round, reactive to light and accommodation III,IV, VI: left ptosis, extra-ocular motions intact bilaterally V,VII: corneals intact bilaterally, facial light touch sensation normal bilaterally VIII: hearing normal bilaterally IX,X: gag reflex present XI: bilateral shoulder shrug XII: midline tongue extension Motor: Moves all extremities against gravity Sensory: Responds to light touch throughout Deep Tendon Reflexes: 2+ and symmetric  with absent AJ's bilaterally Plantars: Right: upgoing   Left: downgoing Cerebellar: Unable to perform due to cooperation Gait: not tested due to safety concerns   Laboratory Studies:  Basic Metabolic Panel:  Recent Labs Lab 12/12/15 0949 12/13/15 0437 12/15/15 0607  NA 139 142 137  K 3.2* 3.7 4.4  CL 107 110 110  CO2 23 24 25   GLUCOSE 158* 96 98  BUN 10 7 5*  CREATININE 0.83 0.77 0.95  CALCIUM 8.7* 9.0 8.6*    Liver Function Tests:  Recent Labs Lab 12/12/15 0949 12/13/15 0437  AST 12* 13*  ALT 9* 9*  ALKPHOS 64 57  BILITOT 0.3 1.3*  PROT 7.5 6.8  ALBUMIN 3.7 3.4*    Recent Labs Lab 12/12/15 0949  LIPASE 74*   No results for input(s): AMMONIA in the last 168 hours.  CBC:  Recent Labs Lab 12/12/15 0949 12/13/15 0437 12/14/15 0447 12/15/15 0607  WBC 9.0 7.1 6.9 6.1  NEUTROABS 2.8  --   --   --   HGB 7.9* 9.6* 9.4* 8.9*  HCT 24.8* 28.8* 29.4* 28.0*  MCV 78.7*  80.8 80.6 80.0  PLT 157 136* 124* 125*    Cardiac Enzymes:  Recent Labs Lab 12/12/15 0949  TROPONINI 0.05*    BNP: Invalid input(s): POCBNP  CBG:  Recent Labs Lab 12/12/15 0944  GLUCAP 148*    Microbiology: Results for orders placed or performed during the hospital encounter of 12/12/15  Blood culture (routine x 2)     Status: None (Preliminary result)   Collection Time: 12/12/15 12:52 PM  Result Value Ref Range Status   Specimen Description BLOOD RIGHT ANTECUBITAL  Final   Special Requests BOTTLES DRAWN AEROBIC AND ANAEROBIC  4CC  Final   Culture NO GROWTH 3 DAYS  Final   Report Status PENDING  Incomplete  Blood culture (routine x 2)     Status: None (Preliminary result)   Collection Time: 12/12/15  1:05 PM  Result Value Ref Range Status   Specimen Description BLOOD LEFT ANTECUBITAL  Final   Special Requests   Final    BOTTLES DRAWN AEROBIC AND ANAEROBIC  AER 5CC ANA 1CC   Culture NO GROWTH 3 DAYS  Final   Report Status PENDING  Incomplete  MRSA PCR Screening     Status: Abnormal   Collection Time: 12/12/15  6:30 PM  Result Value Ref Range Status   MRSA by PCR POSITIVE (A) NEGATIVE Final    Comment:        The GeneXpert MRSA Assay (FDA approved for NASAL specimens only), is one component of a comprehensive MRSA colonization surveillance program. It is not intended to diagnose MRSA infection nor to guide or monitor treatment for MRSA infections. CRITICAL RESULT CALLED TO, READ BACK BY AND VERIFIED WITH: Corine Shelter AT 2224 12/12/15.PMH     Coagulation Studies: No results for input(s): LABPROT, INR in the last 72 hours.  Urinalysis:  Recent Labs Lab 12/12/15 1018  COLORURINE YELLOW*  LABSPEC 1.018  PHURINE 5.0  GLUCOSEU NEGATIVE  HGBUR NEGATIVE  BILIRUBINUR NEGATIVE  KETONESUR NEGATIVE  PROTEINUR NEGATIVE  NITRITE NEGATIVE  LEUKOCYTESUR NEGATIVE    Lipid Panel:    Component Value Date/Time   CHOL 188 12/15/2015 0607   TRIG 77  12/15/2015 0607   HDL 41 12/15/2015 0607   CHOLHDL 4.6 12/15/2015 0607   VLDL 15 12/15/2015 0607   LDLCALC 132* 12/15/2015 0607    HgbA1C: No results found for: HGBA1C  Urine Drug Screen:  No results found for: LABOPIA, COCAINSCRNUR, LABBENZ, AMPHETMU, THCU, LABBARB  Alcohol Level: No results for input(s): ETH in the last 168 hours.  Other results: EKG: sinus rhythm at 66 bpm.  Imaging: Mr Brain Wo Contrast  12/14/2015  CLINICAL DATA:  Patient not acting normal.  Concern for stroke. EXAM: MRI HEAD WITHOUT CONTRAST TECHNIQUE: Multiplanar, multiecho pulse sequences of the brain and surrounding structures were obtained without intravenous contrast. COMPARISON:  11/07/2015 brain MRI FINDINGS: Motion degraded study requiring fast protocol imaging. Calvarium and upper cervical spine: No focal marrow signal abnormality. Orbits: No significant findings. Sinuses and Mastoids: Clear. Brain: Restricted diffusion throughout the left superior cerebellar artery distribution consistent with acute infarct. There are punctate bilateral occipital cortex infarcts. These small infarcts are marked on coronal and axial diffusion weighted sequences. On DWI scan, bright signal is still seen in the region of the left MCA territory infarct noted November 07, 2015. Areas of dark ADC signal in this region are likely from motion as other sequences suggest normal evolution and size stable infarct, with volume loss best seen on sagittal T1 weighted imaging. There is no acute hemorrhage, fourth ventricular effacement, or evidence of major vessel occlusion. Chronic small-vessel disease, mild for age with ischemic gliosis thinly confluent around the lateral ventricles. Normal cerebral volume for age. IMPRESSION: 1. Acute nonhemorrhagic left superior cerebellar infarct and punctate bilateral occipital cortex infarcts. Patent fourth ventricle. 2. Prior left MCA territory infarct without hemorrhagic conversion. 3. Motion degraded study.  Electronically Signed   By: Monte Fantasia M.D.   On: 12/14/2015 14:13    Assessment: 82 y.o. female presenting with slurred speech, weakness and gait abnormalities.  Found to have a GI bleed.  Continued altered mental status from baseline.  MRI of the brain personally reviewed and shows her left MCA infarct from December but also shows an acute left superior cerebellar infarct and acute left and right small occipital infarcts.  Work up in December included a carotid doppler that showed no hemodynamically significant stenosis and an echocardiogram with an EF of 60-65%.  Current LDL 132.   Although with anterior and posterior circulation being involved in such a short period of time can not rule out an embolic etiology, patient is not an anticoagulation candidate due to her GI bleed.  Therefore will not do further cardiac studies (TEE) at this time.  Treatment options are limited.  Risks and benefits of treatments options were discussed with POA.    Stroke Risk Factors - hyperlipidemia  Plan: 1. PT consult, OT consult, Speech consult 2. Prophylactic therapy-Antiplatelet med: Aspirin - dose 81mg  daily 3. Agree with lipid lowering agent.    Alexis Goodell, MD Neurology [phone] 12/15/2015, 11:08 AM

## 2015-12-16 LAB — BASIC METABOLIC PANEL
ANION GAP: 5 (ref 5–15)
BUN: <5 mg/dL — ABNORMAL LOW (ref 6–20)
CO2: 25 mmol/L (ref 22–32)
Calcium: 8.8 mg/dL — ABNORMAL LOW (ref 8.9–10.3)
Chloride: 108 mmol/L (ref 101–111)
Creatinine, Ser: 0.86 mg/dL (ref 0.44–1.00)
GFR, EST NON AFRICAN AMERICAN: 60 mL/min — AB (ref >60)
GLUCOSE: 99 mg/dL (ref 65–99)
POTASSIUM: 4.4 mmol/L (ref 3.5–5.1)
Sodium: 138 mmol/L (ref 135–145)

## 2015-12-16 LAB — CBC
HCT: 27.5 % — ABNORMAL LOW (ref 35.0–47.0)
Hemoglobin: 8.8 g/dL — ABNORMAL LOW (ref 12.0–16.0)
MCH: 25.2 pg — ABNORMAL LOW (ref 26.0–34.0)
MCHC: 31.9 g/dL — ABNORMAL LOW (ref 32.0–36.0)
MCV: 79.1 fL — AB (ref 80.0–100.0)
PLATELETS: 125 10*3/uL — AB (ref 150–440)
RBC: 3.48 MIL/uL — AB (ref 3.80–5.20)
RDW: 15.5 % — ABNORMAL HIGH (ref 11.5–14.5)
WBC: 5.9 10*3/uL (ref 3.6–11.0)

## 2015-12-16 LAB — HEMOGLOBIN: HEMOGLOBIN: 9.5 g/dL — AB (ref 12.0–16.0)

## 2015-12-16 MED ORDER — PRAVASTATIN SODIUM 80 MG PO TABS: 80.0000 mg | ORAL_TABLET | Freq: Every day | ORAL | Status: AC

## 2015-12-16 MED ORDER — SODIUM CHLORIDE 0.9 % IV SOLN
INTRAVENOUS | Status: DC
  Administered 2015-12-16 – 2015-12-17 (×3): via INTRAVENOUS

## 2015-12-16 MED ORDER — ENSURE ENLIVE PO LIQD
237.0000 mL | Freq: Three times a day (TID) | ORAL | Status: DC
  Administered 2015-12-16 (×2): 237 mL via ORAL

## 2015-12-16 NOTE — Plan of Care (Signed)
VSS, afebrile. No c/o pain. Neuro checks Q4h, unchanged  Problem: Fluid Volume: Goal: Will show no signs and symptoms of excessive bleeding Outcome: Progressing IVF infusing as ordered, no blood loss noted. Hgb 8.9  Problem: Safety: Goal: Ability to remain free from injury will improve Outcome: Progressing High fall risk. Bed alarm on, hourly rounding. Safe environment provided.   Problem: Health Behavior/Discharge Planning: Goal: Ability to manage health-related needs will improve Outcome: Progressing Pt is from [facility], could possibly go back at d/c  Problem: Pain Managment: Goal: General experience of comfort will improve Outcome: Progressing No c/o pain, resting comfortably through the night   Problem: Skin Integrity: Goal: Risk for impaired skin integrity will decrease Outcome: Progressing Refuses Q2H turning- able to turn independently when pt wants too, skin care provided- incontinent.   Problem: Fluid Volume: Goal: Ability to maintain a balanced intake and output will improve Outcome: Progressing IVF infusing as order, good output noted.   Problem: Nutrition: Goal: Adequate nutrition will be maintained Outcome: Progressing DYS 2 diet- feeding assistance needed. Poor PO intake

## 2015-12-16 NOTE — Discharge Summary (Signed)
[facility] NAME: [name]    MR#:  [id number]  DATE OF BIRTH:  06-01-1928  DATE OF ADMISSION:  12/12/2015 ADMITTING PHYSICIAN: Max Sane, MD  DATE OF DISCHARGE: 12/16/2015  PRIMARY CARE PHYSICIAN: Mathews Argyle, MD    ADMISSION DIAGNOSIS:  Weakness [R53.1] Healthcare-associated pneumonia [J18.9] Gastrointestinal hemorrhage with melena [K92.1] Vomiting without nausea, intractability of vomiting not specified, unspecified vomiting type [R11.11]  DISCHARGE DIAGNOSIS:  Principal Problem:   GI bleed Active Problems:   Anemia associated with acute blood loss   Persistent pneumonia   Vomiting alone  Acute CVA SECONDARY DIAGNOSIS:   Past Medical History  Diagnosis Date  . Cancer ([place])   . Heart murmur   . Breast cancer ([place])     h/o  . CVA (cerebral infarction)   . GI bleed   . PNA (pneumonia)   . Dementia     HOSPITAL COURSE:   82 year old female with a history of CVA and recent GI bleed who presents again with a GI bleed.  1. GI bleed: Patient had a recent GI bleed about a month ago and was not deemed a candidate for colonoscopy due to her inability to drink the prep and history of dementia. She had a bleeding scan at that time which did show bleeding in the sigmoid colon. Patient has been seen by GI during this hospitalization who also feels that at this point patient is not a candidate for colonoscopy. Her hemoglobin remained stable. If she were to continue to have bleeding then a GI bleeding scan to localize the site would be preferable.   2. Recent pneumonia: It appears by x-ray this may be aspiration. Speech recommends Dysphagia 2 w/ thin liquids; aspiration precautions; feeding support and encouragement - monitoring at meals  Medication Administration: Whole meds with puree. She will be on Levaquin at discharge.  3. Dementia: Continue Zoloft, Seroquel, donepezil and Namenda.  4.. Acute  nonhemorrhagic left. Cerebellar infarct and punctate bilateral occipital cortex infarcts: This is likely embolic in nature. Due to the patient's dementia and GI bleed a TEE would not be of benefit as she will not be able to tolerate the procedure and cannot take full dose anticoagulation due to a cold GI bleed. She is on a baby aspirin and statin. Her hemoglobin was being monitored closely and has been stable. I discussed side effects, benefits and risks with the patient's family who is in agreement to start a baby aspirin. . Patient does not need a Doppler as this was done early in December which showed no evidence of stenosis. She also had a 2-D echocardiogram in early December which shows a normal ejection fraction with normal right heart pressures and therefore does not need a bubble echo.Neurology consult appreciated.  DISCHARGE CONDITIONS AND DIET:  Stable condition DYSPHAGIA 2  CONSULTS OBTAINED:  Treatment Team:  Lucilla Lame, MD Alexis Goodell, MD  DRUG ALLERGIES:   Allergies  Allergen Reactions  . Sulfa Antibiotics Hives    DISCHARGE MEDICATIONS:   Current Discharge Medication List    START taking these medications   Details  levofloxacin (LEVAQUIN) 750 MG tablet Take 1 tablet (750 mg total) by mouth daily. Qty: 5 tablet, Refills: 0  stop date on 12/19/2015       CONTINUE these medications which have NOT CHANGED   Details  cetirizine (ZYRTEC) 10 MG tablet Take 10 mg by mouth daily as needed for allergies.    donepezil (ARICEPT) 10  MG tablet Take 10 mg by mouth at bedtime.    feeding supplement, ENSURE ENLIVE, (ENSURE ENLIVE) LIQD Take 237 mLs by mouth 3 (three) times daily with meals. Qty: 237 mL, Refills: 12    fluticasone (FLONASE) 50 MCG/ACT nasal spray Place 1 spray into both nostrils daily.    memantine (NAMENDA XR) 28 MG CP24 24 hr capsule Take 28 mg by mouth daily.    Menthol-Zinc Oxide (RISAMINE) 0.44-20.625 % OINT Apply 1 application topically as needed (for  irritation).     metoprolol tartrate (LOPRESSOR) 25 MG tablet Take 25 mg by mouth 2 (two) times daily.    pantoprazole (PROTONIX) 40 MG tablet Take 1 tablet (40 mg total) by mouth daily. Qty: 30 tablet, Refills: 0    pravastatin (PRAVACHOL) 40 MG tablet Take 1 tablet (40 mg total) by mouth daily at 6 PM. Qty: 30 tablet, Refills: 0    QUEtiapine (SEROQUEL) 50 MG tablet Take 50 mg by mouth  daily as needed (for agitation).    sertraline (ZOLOFT) 100 MG tablet Take 100 mg by mouth daily.    traZODone (DESYREL) 50 MG tablet Take 50 mg by mouth at bedtime.    zinc oxide 20 % ointment Apply topically as needed for irritation. Qty: 56.7 g, Refills: 0    aspirin EC 81 MG EC tablet Take 1 tablet (81 mg total) by mouth daily. Qty: 30 tablet, Refills: 0    docusate sodium (COLACE) 100 MG capsule Take 1 capsule (100 mg total) by mouth 2 (two) times daily. Qty: 10 capsule, Refills: 0              Today   CHIEF COMPLAINT:  No issues overnight no GIB    VITAL SIGNS:  Blood pressure 139/61, pulse 72, temperature 97.7 F (36.5 C), temperature source Oral, resp. rate 18, height 5\' 6"  (1.676 m), weight 58.423 kg (128 lb 12.8 oz), SpO2 100 %.   REVIEW OF SYSTEMS:  Review of Systems  Constitutional: Negative for fever, chills and malaise/fatigue.  HENT: Negative for sore throat.   Eyes: Negative for blurred vision.  Respiratory: Negative for cough, hemoptysis, shortness of breath and wheezing.   Cardiovascular: Negative for chest pain, palpitations and leg swelling.  Gastrointestinal: Negative for nausea, vomiting, abdominal pain, diarrhea and blood in stool.  Genitourinary: Negative for dysuria.  Musculoskeletal: Negative for back pain.  Neurological: Negative for dizziness, tremors and headaches.  Endo/Heme/Allergies: Does not bruise/bleed easily.     PHYSICAL EXAMINATION:  GENERAL:  82 y.o.-year-old patient lying in the bed with no acute distress.  NECK:  Supple, no  jugular venous distention. No thyroid enlargement, no tenderness.  LUNGS: Normal breath sounds bilaterally, no wheezing, rales,rhonchi  No use of accessory muscles of respiration.  CARDIOVASCULAR: S1, S2 normal. No murmurs, rubs, or gallops.  ABDOMEN: Soft, non-tender, non-distended. Bowel sounds present. No organomegaly or mass.  EXTREMITIES: No pedal edema, cyanosis, or clubbing.  PSYCHIATRIC: The patient is alert and oriented x name  SKIN: No obvious rash, lesion, or ulcer.   DATA REVIEW:   CBC  Recent Labs Lab 12/16/15 0549  WBC 5.9  HGB 8.8*  HCT 27.5*  PLT 125*    Chemistries   Recent Labs Lab 12/13/15 0437  12/16/15 0549  NA 142  < > 138  K 3.7  < > 4.4  CL 110  < > 108  CO2 24  < > 25  GLUCOSE 96  < > 99  BUN 7  < > <5*  CREATININE 0.77  < > 0.86  CALCIUM 9.0  < > 8.8*  AST 13*  --   --   ALT 9*  --   --   ALKPHOS 57  --   --   BILITOT 1.3*  --   --   < > = values in this interval not displayed.  Cardiac Enzymes  Recent Labs Lab 12/12/15 0949  TROPONINI 0.05*    Microbiology Results  @[email or web address] @  RADIOLOGY:  Mr Brain Wo Contrast  12/14/2015  CLINICAL DATA:  Patient not acting normal.  Concern for stroke. EXAM: MRI HEAD WITHOUT CONTRAST TECHNIQUE: Multiplanar, multiecho pulse sequences of the brain and surrounding structures were obtained without intravenous contrast. COMPARISON:  11/07/2015 brain MRI FINDINGS: Motion degraded study requiring fast protocol imaging. Calvarium and upper cervical spine: No focal marrow signal abnormality. Orbits: No significant findings. Sinuses and Mastoids: Clear. Brain: Restricted diffusion throughout the left superior cerebellar artery distribution consistent with acute infarct. There are punctate bilateral occipital cortex infarcts. These small infarcts are marked on coronal and axial diffusion weighted sequences. On DWI scan, bright signal is still seen in the region of the left MCA territory infarct noted November 07, 2015. Areas of dark ADC signal in this region are likely from motion as other sequences suggest normal evolution and size stable infarct, with volume loss best seen on sagittal T1 weighted imaging. There is no acute hemorrhage, fourth ventricular effacement, or evidence of major vessel occlusion. Chronic small-vessel disease, mild for age with ischemic gliosis thinly confluent around the lateral ventricles. Normal cerebral volume for age. IMPRESSION: 1. Acute nonhemorrhagic left superior cerebellar infarct and punctate bilateral occipital cortex infarcts. Patent fourth ventricle. 2. Prior left MCA territory infarct without hemorrhagic conversion. 3. Motion degraded study. Electronically Signed   By: Monte Fantasia M.D.   On: 12/14/2015 14:13     Stable for discharge   Patient should follow up with PCP in 1 week Left message with POA Olivia Mackie  CODE STATUS:     Code Status Orders        Start     Ordered   12/12/15 1402  Do not attempt resuscitation (DNR)   Continuous    Question Answer Comment  In the event of cardiac or respiratory ARREST Do not call a "code blue"   In the event of cardiac or respiratory ARREST Do not perform Intubation, CPR, defibrillation or ACLS   In the event of cardiac or respiratory ARREST Use medication by any route, position, wound care, and other measures to relive pain and suffering. May use oxygen, suction and manual treatment of airway obstruction as needed for comfort.      12/12/15 1401    Advance Directive Documentation        Most Recent Value   Type of Advance Directive  Healthcare Power of Attorney, Living will   Pre-existing out of facility DNR order (yellow form or pink MOST form)     "MOST" Form in Place?        TOTAL TIME TAKING CARE OF THIS PATIENT: 35 minutes.    Note: This dictation was prepared with Dragon dictation along with smaller phrase technology. Any transcriptional errors that result from this process are unintentional.  Jayion Schneck,  Tremell Reimers M.D on 12/16/2015 at 11:47 AM  Between 7am to 6pm - Pager - [phone] After 6pm go to [email or web address] - password [email or web address] [facility]  [facility]  Office  [phone]  CC: Primary care physician; Mathews Argyle, MD

## 2015-12-16 NOTE — Progress Notes (Addendum)
Nutrition Follow-up   INTERVENTION:   Meals and Snacks: Cater to patient preferences providing assistance at all meal times; SLP following Medical Food Supplement Therapy: recommend addition of Ensure Enlive po TID, each supplement provides 350 kcal and 20 grams of protein; will send on meal trays as pt on isolation   NUTRITION DIAGNOSIS:   Inadequate oral intake related to acute illness, altered GI function as evidenced by NPO status; improved with diet advancement  GOAL:   Patient will meet greater than or equal to 90% of their needs; ongoing  MONITOR:    (Energy Intake, Anthropometrics, Digestive System, Electrolyte/Renal Profile)  REASON FOR ASSESSMENT:   Consult Poor PO  ASSESSMENT:    Pt possibly for discharge to SNF when bed available. Pt sitting up eating breakfast this am.   Diet Order:  DIET DYS 2 Room service appropriate?: Yes with Assist; Fluid consistency:: Thin    Current Nutrition: Pt eating much better this am per Nsg. 75% of meal recorded this am.    Gastrointestinal Profile: Last BM: 12/16/2015   Scheduled Medications:  . ampicillin-sulbactam (UNASYN) IV  1.5 g Intravenous Q6H  . aspirin  81 mg Oral Daily  . atorvastatin  20 mg Oral q1800  . Chlorhexidine Gluconate Cloth  6 each Topical Q0600  . docusate sodium  100 mg Oral BID  . donepezil  10 mg Oral QHS  . fluticasone  1 spray Each Nare Daily  . loratadine  10 mg Oral Daily  . memantine  28 mg Oral Daily  . metoprolol tartrate  25 mg Oral BID  . mupirocin ointment  1 application Nasal BID  . pantoprazole (PROTONIX) IV  40 mg Intravenous Q12H  . sertraline  100 mg Oral Daily  . traZODone  50 mg Oral QHS    Continuous Medications:  . sodium chloride 100 mL/hr at 12/16/15 0500     Electrolyte/Renal Profile and Glucose Profile:   Recent Labs Lab 12/13/15 0437 12/15/15 0607 12/16/15 0549  NA 142 137 138  K 3.7 4.4 4.4  CL 110 110 108  CO2 24 25 25   BUN 7 5* <5*  CREATININE 0.77  0.95 0.86  CALCIUM 9.0 8.6* 8.8*  GLUCOSE 96 98 99   Protein Profile:  Recent Labs Lab 12/12/15 0949 12/13/15 0437  ALBUMIN 3.7 3.4*     Weight Trend since Admission: Filed Weights   12/13/15 0525 12/14/15 0500 12/16/15 0500  Weight: 133 lb 14.4 oz (60.737 kg) 130 lb (58.968 kg) 128 lb 12.8 oz (58.423 kg)     BMI:  Body mass index is 20.8 kg/(m^2).  Estimated Nutritional Needs:   Kcal:  [id number] kcals (BEE 1069, 1.3 AF, 1.1-1.2 IF)   Protein:  61-73 g (1.0-1.2 g/kg)   Fluid:  1525-1830 mL (25-30 ml/kg)    MODERATE Care Level  Dwyane Luo, RD, LDN Pager [phone] Weekend/On-Call Pager [phone]

## 2015-12-16 NOTE — Progress Notes (Signed)
Physical Therapy Treatment Patient Details Name: [name] MRN: [id number] DOB: 10-14-1928 Today's Date: 12/16/2015    History of Present Illness [name] is a 82 y.o. female has a past medical history significant for CVA, recent pneumonia, and recent GI bleed(presumably jeujunal) now with recurrent N/V with dark blood per rectum. Hgb=7.9 in ER. Patient is extremely weak and is now admitted. Still c/o SOB and cough.  12/13/14 MRI + for Acute nonhemorrhagic left superior cerebellar infarct and punctate bilateral occipital cortex infarcts.   PT Comments    Pt easily aroused this session and more alert than previous session, but continues to resist participation with PT, wanting to sleep instead.  Pt found to be soiled and resisted getting cleaned up, requiring Max A for rolling both to L and R sides.  Daughter present for treatment and assisted with encouragement to get pt up out of bed.  Pt required Max A for supine<>sit transfers, Min A to maintain sitting balance EOB, and total assist for standing.  Pt unable to ambulate or transfer to chair at this time.  Given's pt current level of care,  recommendation for SNF continues to be appropriate for patient given's pt's diagnosis to maximize potential to have pt return to prior living arrangement.  Spoke at length with daughter who is asking for Palliative Care at pt's prior living arrangement [facility]).   Follow Up Recommendations  SNF     Equipment Recommendations  Hospital bed    Recommendations for Other Services       Precautions / Restrictions Restrictions Weight Bearing Restrictions: No    Mobility  Bed Mobility Overal bed mobility: Needs Assistance;+ 2 for safety/equipment Bed Mobility: Supine to Sit;Sit to Supine     Supine to sit: Max assist Sit to supine: Mod assist   General bed mobility comments: Pt reluctant to move out of bed and required Max A for supine to sit transfer, attempting to put legs  back on bed throughout session; In sitting pt with poor postural control, able to maintain static for 30 seconds.  Transfers Overall transfer level: Needs assistance Equipment used: 2 person hand held assist Transfers: Sit to/from Stand Sit to Stand: Total assist General transfer comment: Sit<>stand attempted x3 trials with total assist required for erect standing with heavy posterior lean.  Ambulation/Gait  General Gait Details: Pt unable to ambulate at this time due to poor postural control with standing.    Balance Overall balance assessment: Needs assistance Sitting-balance support: Bilateral upper extremity supported;Feet supported Sitting balance-Leahy Scale: Fair   Postural control: Posterior lean Standing balance support: Bilateral upper extremity supported Standing balance-Leahy Scale: Zero Standing balance comment: total assist       Cognition Arousal/Alertness: Lethargic Behavior During Therapy: Agitated Overall Cognitive Status: History of cognitive impairments - at baseline      Exercises Other Exercises Other Exercises: Functional mobility:  rolling L<>R; supine<>sit; sitting balance; sit<>stand transfers    General Comments General comments (skin integrity, edema, etc.): rash over bilateral buttocks      Pertinent Vitals/Pain Pain Assessment: No/denies pain    Home Living     Prior Function      PT Goals (current goals can now be found in the care plan section) Acute Rehab PT Goals Patient Stated Goal: none stated PT Goal Formulation: With family Time For Goal Achievement: 12/29/15 Potential to Achieve Goals: Fair Progress towards PT goals: Progressing toward goals    Frequency  7X/week    PT  Plan Frequency needs to be updated    Co-evaluation             End of Session Equipment Utilized During Treatment: Gait belt Activity Tolerance: Patient limited by fatigue;Patient limited by lethargy;Treatment limited secondary to  agitation Patient left: in bed;with family/visitor present;with bed alarm set     Time: [id number] PT Time Calculation (min) (ACUTE ONLY): 33 min  Charges:  $Therapeutic Activity: 23-37 mins                    G Codes:      Annsleigh Dragoo A Eydan Chianese, PT Jan 10, 2016, 12:42 PM

## 2015-12-16 NOTE — Care Management Important Message (Signed)
Important Message  Patient Details  Name: ILIANI VONWALD MRN: [id number] Date of Birth: [birth year]   Medicare Important Message Given:  Yes    Shelbie Ammons, RN 12/16/2015, 10:13 AM

## 2015-12-16 NOTE — Discharge Summary (Addendum)
[place] [facility] [place] NAME: [name]    MR#:  [id number]  DATE OF BIRTH:  July 07, 1928  DATE OF ADMISSION:  12/12/2015 ADMITTING PHYSICIAN: Max Sane, MD  DATE OF DISCHARGE: 12/16/2015  PRIMARY CARE PHYSICIAN: Mathews Argyle, MD    ADMISSION DIAGNOSIS:  Weakness [R53.1] Healthcare-associated pneumonia [J18.9] Gastrointestinal hemorrhage with melena [K92.1] Vomiting without nausea, intractability of vomiting not specified, unspecified vomiting type [R11.11]  DISCHARGE DIAGNOSIS:  Principal Problem:   GI bleed Active Problems:   Anemia associated with acute blood loss   Persistent pneumonia   Vomiting alone  Acute CVA SECONDARY DIAGNOSIS:   Past Medical History  Diagnosis Date  . Cancer ([place])   . Heart murmur   . Breast cancer ([place])     h/o  . CVA (cerebral infarction)   . GI bleed   . PNA (pneumonia)   . Dementia     HOSPITAL COURSE:   82 year old female with a history of CVA and recent GI bleed who presents again with a GI bleed.  1. GI bleed: Patient had a recent GI bleed about a month ago and was not deemed a candidate for colonoscopy due to her inability to drink the prep and history of dementia. She had a bleeding scan at that time which did show bleeding in the sigmoid colon. Patient has been seen by GI during this hospitalization who also feels that at this point patient is not a candidate for colonoscopy. Her hemoglobin remained stable. If she were to continue to have bleeding then a GI bleeding scan to localize the site would be preferable.   2. Recent pneumonia: It appears by x-ray this may be aspiration. Speech recommends Dysphagia 2 w/ thin liquids; aspiration precautions; feeding support and encouragement - monitoring at meals  Medication Administration: Whole meds with puree. She will be on Levaquin at discharge.  3. Dementia: Continue Zoloft, Seroquel, donepezil and Namenda.  4.. Acute  nonhemorrhagic left. Cerebellar infarct and punctate bilateral occipital cortex infarcts: This is likely embolic in nature. Due to the patient's dementia and GI bleed a TEE would not be of benefit as she will not be able to tolerate the procedure and cannot take full dose anticoagulation due to a cold GI bleed. She is on a baby aspirin and statin. Her hemoglobin was being monitored closely and has been stable. I discussed side effects, benefits and risks with the patient's family who is in agreement to start a baby aspirin. . Patient does not need a Doppler as this was done early in December which showed no evidence of stenosis. She also had a 2-D echocardiogram in early December which shows a normal ejection fraction with normal right heart pressures and therefore does not need a bubble echo.Neurology consult appreciated.  DISCHARGE CONDITIONS AND DIET:  Stable condition DYSPHAGIA 2  CONSULTS OBTAINED:  Treatment Team:  Lucilla Lame, MD Alexis Goodell, MD  DRUG ALLERGIES:   Allergies  Allergen Reactions  . Sulfa Antibiotics Hives    DISCHARGE MEDICATIONS:   Current Discharge Medication List    START taking these medications   Details  levofloxacin (LEVAQUIN) 750 MG tablet Take 1 tablet (750 mg total) by mouth daily. Qty: 5 tablet, Refills: 0  stop date on 12/19/2015       CONTINUE these medications which have NOT CHANGED   Details  cetirizine (ZYRTEC) 10 MG tablet Take 10 mg by mouth daily as needed for allergies.    donepezil (ARICEPT) 10  MG tablet Take 10 mg by mouth at bedtime.    feeding supplement, ENSURE ENLIVE, (ENSURE ENLIVE) LIQD Take 237 mLs by mouth 3 (three) times daily with meals. Qty: 237 mL, Refills: 12    fluticasone (FLONASE) 50 MCG/ACT nasal spray Place 1 spray into both nostrils daily.    memantine (NAMENDA XR) 28 MG CP24 24 hr capsule Take 28 mg by mouth daily.    Menthol-Zinc Oxide (RISAMINE) 0.44-20.625 % OINT Apply 1 application topically as needed (for  irritation).     metoprolol tartrate (LOPRESSOR) 25 MG tablet Take 25 mg by mouth 2 (two) times daily.    pantoprazole (PROTONIX) 40 MG tablet Take 1 tablet (40 mg total) by mouth daily. Qty: 30 tablet, Refills: 0    pravastatin (PRAVACHOL) 40 MG tablet Take 1 tablet (40 mg total) by mouth daily at 6 PM. Qty: 30 tablet, Refills: 0    QUEtiapine (SEROQUEL) 50 MG tablet Take 50 mg by mouth  daily as needed (for agitation).    sertraline (ZOLOFT) 100 MG tablet Take 100 mg by mouth daily.    traZODone (DESYREL) 50 MG tablet Take 50 mg by mouth at bedtime.    zinc oxide 20 % ointment Apply topically as needed for irritation. Qty: 56.7 g, Refills: 0    aspirin EC 81 MG EC tablet Take 1 tablet (81 mg total) by mouth daily. Qty: 30 tablet, Refills: 0    docusate sodium (COLACE) 100 MG capsule Take 1 capsule (100 mg total) by mouth 2 (two) times daily. Qty: 10 capsule, Refills: 0              Today   CHIEF COMPLAINT:  No issues overnight no GIB    VITAL SIGNS:  Blood pressure 139/61, pulse 72, temperature 97.7 F (36.5 C), temperature source Oral, resp. rate 18, height 5\' 6"  (1.676 m), weight 58.423 kg (128 lb 12.8 oz), SpO2 100 %.   REVIEW OF SYSTEMS:  Review of Systems  Constitutional: Negative for fever, chills and malaise/fatigue.  HENT: Negative for sore throat.   Eyes: Negative for blurred vision.  Respiratory: Negative for cough, hemoptysis, shortness of breath and wheezing.   Cardiovascular: Negative for chest pain, palpitations and leg swelling.  Gastrointestinal: Negative for nausea, vomiting, abdominal pain, diarrhea and blood in stool.  Genitourinary: Negative for dysuria.  Musculoskeletal: Negative for back pain.  Neurological: Negative for dizziness, tremors and headaches.  Endo/Heme/Allergies: Does not bruise/bleed easily.     PHYSICAL EXAMINATION:  GENERAL:  82 y.o.-year-old patient lying in the bed with no acute distress.  NECK:  Supple, no  jugular venous distention. No thyroid enlargement, no tenderness.  LUNGS: Normal breath sounds bilaterally, no wheezing, rales,rhonchi  No use of accessory muscles of respiration.  CARDIOVASCULAR: S1, S2 normal. No murmurs, rubs, or gallops.  ABDOMEN: Soft, non-tender, non-distended. Bowel sounds present. No organomegaly or mass.  EXTREMITIES: No pedal edema, cyanosis, or clubbing.  PSYCHIATRIC: The patient is alert and oriented x name  SKIN: No obvious rash, lesion, or ulcer.   DATA REVIEW:   CBC  Recent Labs Lab 12/16/15 0549  WBC 5.9  HGB 8.8*  HCT 27.5*  PLT 125*    Chemistries   Recent Labs Lab 12/13/15 0437  12/16/15 0549  NA 142  < > 138  K 3.7  < > 4.4  CL 110  < > 108  CO2 24  < > 25  GLUCOSE 96  < > 99  BUN 7  < > <5*  CREATININE 0.77  < > 0.86  CALCIUM 9.0  < > 8.8*  AST 13*  --   --   ALT 9*  --   --   ALKPHOS 57  --   --   BILITOT 1.3*  --   --   < > = values in this interval not displayed.  Cardiac Enzymes  Recent Labs Lab 12/12/15 0949  TROPONINI 0.05*    Microbiology Results  @[email or web address] @  RADIOLOGY:  Mr Brain Wo Contrast  12/14/2015  CLINICAL DATA:  Patient not acting normal.  Concern for stroke. EXAM: MRI HEAD WITHOUT CONTRAST TECHNIQUE: Multiplanar, multiecho pulse sequences of the brain and surrounding structures were obtained without intravenous contrast. COMPARISON:  11/07/2015 brain MRI FINDINGS: Motion degraded study requiring fast protocol imaging. Calvarium and upper cervical spine: No focal marrow signal abnormality. Orbits: No significant findings. Sinuses and Mastoids: Clear. Brain: Restricted diffusion throughout the left superior cerebellar artery distribution consistent with acute infarct. There are punctate bilateral occipital cortex infarcts. These small infarcts are marked on coronal and axial diffusion weighted sequences. On DWI scan, bright signal is still seen in the region of the left MCA territory infarct noted November 07, 2015. Areas of dark ADC signal in this region are likely from motion as other sequences suggest normal evolution and size stable infarct, with volume loss best seen on sagittal T1 weighted imaging. There is no acute hemorrhage, fourth ventricular effacement, or evidence of major vessel occlusion. Chronic small-vessel disease, mild for age with ischemic gliosis thinly confluent around the lateral ventricles. Normal cerebral volume for age. IMPRESSION: 1. Acute nonhemorrhagic left superior cerebellar infarct and punctate bilateral occipital cortex infarcts. Patent fourth ventricle. 2. Prior left MCA territory infarct without hemorrhagic conversion. 3. Motion degraded study. Electronically Signed   By: Monte Fantasia M.D.   On: 12/14/2015 14:13     Stable for discharge   Patient should follow up with PCP in 1 week Left message with POA Olivia Mackie  CODE STATUS:     Code Status Orders        Start     Ordered   12/12/15 1402  Do not attempt resuscitation (DNR)   Continuous    Question Answer Comment  In the event of cardiac or respiratory ARREST Do not call a "code blue"   In the event of cardiac or respiratory ARREST Do not perform Intubation, CPR, defibrillation or ACLS   In the event of cardiac or respiratory ARREST Use medication by any route, position, wound care, and other measures to relive pain and suffering. May use oxygen, suction and manual treatment of airway obstruction as needed for comfort.      12/12/15 1401    Advance Directive Documentation        Most Recent Value   Type of Advance Directive  Healthcare Power of Attorney, Living will   Pre-existing out of facility DNR order (yellow form or pink MOST form)     "MOST" Form in Place?        TOTAL TIME TAKING CARE OF THIS PATIENT: 35 minutes.    Note: This dictation was prepared with Dragon dictation along with smaller phrase technology. Any transcriptional errors that result from this process are unintentional.  Kimm Sider,  Desma Wilkowski M.D on 12/16/2015 at 9:55 AM  Between 7am to 6pm - Pager - [phone] After 6pm go to [email or web address] - password [email or web address] [facility]  [facility]  Office  [phone]  CC: Primary care physician; Mathews Argyle, MD

## 2015-12-16 NOTE — Clinical Social Work Note (Signed)
CSW spoke with pt's daughter regarding discharge plan. Pt's daughter would like pt to return to [facility] and expressed interest in [facility] services. CSW updated [facility] regarding this change. Facility representative will have to do an evaluation prior to discharge. CSW updated MD. CSW will continue to follow.   Darden Dates, MSW, LCSW  Clinical Social Worker [phone]

## 2015-12-16 NOTE — Plan of Care (Signed)
Problem: Health Behavior/Discharge Planning: Goal: Ability to manage health-related needs will improve Outcome: Progressing PT recommanded SNF. Per Social worker note [employer] representative will reevaluate if pt can go back to same facility.Palliative consult ordered.   Problem: Nutrition: Goal: Adequate nutrition will be maintained Outcome: Progressing Good appetite. Pt ate 75% of breakfast and 90% of supper.

## 2015-12-17 LAB — CBC
HEMATOCRIT: 28.9 % — AB (ref 35.0–47.0)
Hemoglobin: 9.4 g/dL — ABNORMAL LOW (ref 12.0–16.0)
MCH: 26.1 pg (ref 26.0–34.0)
MCHC: 32.4 g/dL (ref 32.0–36.0)
MCV: 80.6 fL (ref 80.0–100.0)
PLATELETS: 120 10*3/uL — AB (ref 150–440)
RBC: 3.59 MIL/uL — ABNORMAL LOW (ref 3.80–5.20)
RDW: 16.1 % — AB (ref 11.5–14.5)
WBC: 6.9 10*3/uL (ref 3.6–11.0)

## 2015-12-17 LAB — CULTURE, BLOOD (ROUTINE X 2)
CULTURE: NO GROWTH
Culture: NO GROWTH

## 2015-12-17 LAB — GLUCOSE, CAPILLARY: Glucose-Capillary: 106 mg/dL — ABNORMAL HIGH (ref 65–99)

## 2015-12-17 NOTE — Progress Notes (Signed)
Patient left with EMS. Daughter notified.

## 2015-12-17 NOTE — Clinical Social Work Note (Signed)
Pt is ready for discharge today to [facility]. Pt will have [facility] will be following at the facility. CSW provided supportive counseling to pt's family. [facility] is able to take pt back. CSW sent Discharge Summary and FL2 to facility. Pt will be transported via EMS. CSW is signing off as no further needs identified.   Darden Dates, MSW, LCSW  Clinical Social Worker  [phone]

## 2015-12-17 NOTE — Progress Notes (Signed)
[place] [facility] [place] NAME: [name]    MR#:  [id number]  DATE OF BIRTH:  [birth year]  SUBJECTIVE:  No issues overnight  REVIEW OF SYSTEMS:    Review of Systems  Constitutional: Negative for fever, chills and malaise/fatigue.  HENT: Negative for sore throat.   Eyes: Negative for blurred vision.  Respiratory: Negative for cough, hemoptysis, shortness of breath and wheezing.   Cardiovascular: Negative for chest pain, palpitations and leg swelling.  Gastrointestinal: Negative for nausea, vomiting, abdominal pain, diarrhea and blood in stool.  Genitourinary: Negative for dysuria.  Musculoskeletal: Negative for back pain.  Neurological: Negative for dizziness, tremors and headaches.  Endo/Heme/Allergies: Does not bruise/bleed easily.    Tolerating Diet:yes      DRUG ALLERGIES:   Allergies  Allergen Reactions  . Sulfa Antibiotics Hives    VITALS:  Blood pressure 146/68, pulse 71, temperature 97.3 F (36.3 C), temperature source Oral, resp. rate 17, height 5\' 6"  (1.676 m), weight 58.469 kg (128 lb 14.4 oz), SpO2 100 %.  PHYSICAL EXAMINATION:   Physical Exam  Constitutional: She is well-developed, well-nourished, and in no distress. No distress.  HENT:  Head: Normocephalic.  Eyes: No scleral icterus.  Neck: Normal range of motion. Neck supple. No JVD present. No tracheal deviation present.  Cardiovascular: Normal rate, regular rhythm and normal heart sounds.  Exam reveals no gallop and no friction rub.   No murmur heard. Pulmonary/Chest: Effort normal and breath sounds normal. No respiratory distress. She has no wheezes. She has no rales. She exhibits no tenderness.  Abdominal: Soft. Bowel sounds are normal. She exhibits no distension and no mass. There is no tenderness. There is no rebound and no guarding.  Musculoskeletal: Normal range of motion. She exhibits no edema.  Neurological: She is alert.  Skin: Skin is  warm. No rash noted. No erythema.      LABORATORY PANEL:   CBC  Recent Labs Lab 12/17/15 0657  WBC 6.9  HGB 9.4*  HCT 28.9*  PLT 120*   ------------------------------------------------------------------------------------------------------------------  Chemistries   Recent Labs Lab 12/13/15 0437  12/16/15 0549  NA 142  < > 138  K 3.7  < > 4.4  CL 110  < > 108  CO2 24  < > 25  GLUCOSE 96  < > 99  BUN 7  < > <5*  CREATININE 0.77  < > 0.86  CALCIUM 9.0  < > 8.8*  AST 13*  --   --   ALT 9*  --   --   ALKPHOS 57  --   --   BILITOT 1.3*  --   --   < > = values in this interval not displayed. ------------------------------------------------------------------------------------------------------------------  Cardiac Enzymes  Recent Labs Lab 12/12/15 0949  TROPONINI 0.05*   ------------------------------------------------------------------------------------------------------------------  RADIOLOGY:  No results found.   ASSESSMENT AND PLAN:  82 year old female with a history of CVA and recent GI bleed who presents again with a GI bleed.  1. GI bleed: Patient had a recent GI bleed about a month ago and was not deemed a candidate for colonoscopy due to her inability to drink the prep and history of dementia. She had a bleeding scan at that time which did show bleeding in the sigmoid colon. Patient has been seen by GI during this hospitalization who also feels that at this point patient is not a candidate for colonoscopy. Her hemoglobin remained stable. If she were to continue to have bleeding  then a GI bleeding scan to localize the site would be preferable.   2. Recent pneumonia: It appears by x-ray this may be aspiration. Speech recommends Dysphagia 2 w/ thin liquids; aspiration precautions; feeding support and encouragement - monitoring at meals  Medication Administration: Whole meds with puree. She will be on Levaquin at discharge.  3. Dementia: Continue Zoloft,  Seroquel, donepezil and Namenda.  4.. Acute nonhemorrhagic left. Cerebellar infarct and punctate bilateral occipital cortex infarcts: This is likely embolic in nature. Due to the patient's dementia and GI bleed a TEE would not be of benefit as she will not be able to tolerate the procedure and cannot take full dose anticoagulation due to a cold GI bleed. She is on a baby aspirin and statin. Her hemoglobin was being monitored closely and has been stable. I discussed side effects, benefits and risks with the patient's family who is in agreement to start a baby aspirin. . Patient does not need a Doppler as this was done early in December which showed no evidence of stenosis. She also had a 2-D echocardiogram in early December which shows a normal ejection fraction with normal right heart pressures and therefore does not need a bubble echo.Neurology consult appreciated.       CODE STATUS: FULL  TOTAL TIME TAKING CARE OF THIS PATIENT: 26 minutes.     POSSIBLE D/C today, DEPENDING ON CLINICAL CONDITION.   [name] M.D on 12/17/2015 at 8:06 AM  Between 7am to 6pm - Pager - [phone] After 6pm go to [email or web address] - password [email or web address] [place] [facility]  Office  [phone]  CC: Primary care physician; Teresa Argyle, MD  Note: This dictation was prepared with Dragon dictation along with smaller phrase technology. Any transcriptional errors that result from this process are unintentional.

## 2015-12-17 NOTE — NC FL2 (Signed)
[place] LEVEL OF CARE SCREENING TOOL     IDENTIFICATION  Patient Name: [name] Birthdate: June 13, 1928 Sex: female Admission Date (Current Location): 12/12/2015  [facility] and [place] Number:  Engineering geologist and Address:  [facility], [address], [place], [place] [address]      Provider Number: [id number]  Attending Physician Name and Address:  Bettey Costa, MD  Relative Name and Phone Number:       Current Level of Care: Hospital Recommended Level of Care: Memory Care Prior Approval Number:    Date Approved/Denied:   PASRR Number:    Discharge Plan: Domiciliary (Rest home)    Current Diagnoses: Patient Active Problem List   Diagnosis Date Noted  . Vomiting alone   . GI bleed 12/12/2015  . Anemia associated with acute blood loss 12/12/2015  . Persistent pneumonia 12/12/2015  . Fall 11/23/2015  . CVA (cerebral infarction) 11/08/2015  . Encephalopathy 11/07/2015    Orientation RESPIRATION BLADDER Height & Weight    Self  Normal Incontinent 5\' 6"  (167.6 cm) 128 lbs.  BEHAVIORAL SYMPTOMS/MOOD NEUROLOGICAL BOWEL NUTRITION STATUS      Incontinent Diet (Mechanical Soft)  AMBULATORY STATUS COMMUNICATION OF NEEDS Skin   Extensive Assist Verbally Normal                       Personal Care Assistance Level of Assistance  Bathing, Feeding, Dressing Bathing Assistance: Maximum assistance Feeding assistance: Maximum assistance Dressing Assistance: Maximum assistance     Functional Limitations Info  Hearing, Speech, Sight Sight Info: Impaired Hearing Info: Impaired Speech Info: Impaired    SPECIAL CARE FACTORS FREQUENCY                       Contractures      Additional Factors Info  Code Status, Allergies Code Status Info: DNR Allergies Info: Sulfa Antibiotics           Current Medications (12/17/2015):  This is the current hospital active medication list Current Facility-Administered  Medications  Medication Dose Route Frequency Provider Last Rate Last Dose  . 0.9 %  sodium chloride infusion   Intravenous Continuous Saundra Shelling, MD 100 mL/hr at 12/17/15 0507    . acetaminophen (TYLENOL) tablet 650 mg  650 mg Oral Q6H PRN Idelle Crouch, MD       Or  . acetaminophen (TYLENOL) suppository 650 mg  650 mg Rectal Q6H PRN Idelle Crouch, MD      . ampicillin-sulbactam (UNASYN) 1.5 g in sodium chloride 0.9 % 50 mL IVPB  1.5 g Intravenous Q6H Bettey Costa, MD   1.5 g at 12/17/15 0220  . aspirin chewable tablet 81 mg  81 mg Oral Daily Bettey Costa, MD   81 mg at 12/17/15 0944  . atorvastatin (LIPITOR) tablet 20 mg  20 mg Oral q1800 Bettey Costa, MD   20 mg at 12/16/15 1703  . bisacodyl (DULCOLAX) suppository 10 mg  10 mg Rectal Daily PRN Idelle Crouch, MD      . docusate sodium (COLACE) capsule 100 mg  100 mg Oral BID Idelle Crouch, MD   100 mg at 12/16/15 0928  . donepezil (ARICEPT) tablet 10 mg  10 mg Oral QHS Idelle Crouch, MD   10 mg at 12/16/15 2310  . feeding supplement (ENSURE ENLIVE) (ENSURE ENLIVE) liquid 237 mL  237 mL Oral TID WC Sital Mody, MD   237 mL at  12/16/15 1703  . fluticasone (FLONASE) 50 MCG/ACT nasal spray 1 spray  1 spray Each Nare Daily Idelle Crouch, MD   1 spray at 12/17/15 0948  . ipratropium-albuterol (DUONEB) 0.5-2.5 (3) MG/3ML nebulizer solution 3 mL  3 mL Nebulization Q4H PRN Sital Mody, MD      . loratadine (CLARITIN) tablet 10 mg  10 mg Oral Daily Idelle Crouch, MD   10 mg at 12/17/15 0944  . memantine (NAMENDA XR) 24 hr capsule 28 mg  28 mg Oral Daily Idelle Crouch, MD   28 mg at 12/17/15 0944  . metoprolol tartrate (LOPRESSOR) tablet 25 mg  25 mg Oral BID Idelle Crouch, MD   25 mg at 12/17/15 0944  . mupirocin ointment (BACTROBAN) 2 % 1 application  1 application Nasal BID Bettey Costa, MD   1 application at [id number]  . ondansetron (ZOFRAN) tablet 4 mg  4 mg Oral Q6H PRN Idelle Crouch, MD       Or  . ondansetron [facility])  injection 4 mg  4 mg Intravenous Q6H PRN Idelle Crouch, MD      . pantoprazole (PROTONIX) injection 40 mg  40 mg Intravenous Q12H Idelle Crouch, MD   40 mg at 12/16/15 2311  . QUEtiapine (SEROQUEL) tablet 200 mg  200 mg Oral BID PRN Idelle Crouch, MD      . sertraline (ZOLOFT) tablet 100 mg  100 mg Oral Daily Idelle Crouch, MD   100 mg at 12/17/15 0944  . traZODone (DESYREL) tablet 50 mg  50 mg Oral QHS Idelle Crouch, MD   50 mg at 12/16/15 2257  . zinc oxide 20 % ointment   Topical PRN Idelle Crouch, MD         Discharge Medications: Please see discharge summary for a list of discharge medications.  Current Discharge Medication List    START taking these medications   Details  levofloxacin (LEVAQUIN) 750 MG tablet Take 1 tablet (750 mg total) by mouth daily. Qty: 2 tablet, Refills: 0 stop date on 12/19/2015       CONTINUE these medications which have NOT CHANGED   Details  cetirizine (ZYRTEC) 10 MG tablet Take 10 mg by mouth daily as needed for allergies.    donepezil (ARICEPT) 10 MG tablet Take 10 mg by mouth at bedtime.    feeding supplement, ENSURE ENLIVE, (ENSURE ENLIVE) LIQD Take 237 mLs by mouth 3 (three) times daily with meals. Qty: 237 mL, Refills: 12    fluticasone (FLONASE) 50 MCG/ACT nasal spray Place 1 spray into both nostrils daily.    memantine (NAMENDA XR) 28 MG CP24 24 hr capsule Take 28 mg by mouth daily.    Menthol-Zinc Oxide (RISAMINE) 0.44-20.625 % OINT Apply 1 application topically as needed (for irritation).     metoprolol tartrate (LOPRESSOR) 25 MG tablet Take 25 mg by mouth 2 (two) times daily.    pantoprazole (PROTONIX) 40 MG tablet Take 1 tablet (40 mg total) by mouth daily. Qty: 30 tablet, Refills: 0    pravastatin (PRAVACHOL) 40 MG tablet Take 1 tablet (40 mg total) by mouth daily at 6 PM. Qty: 30 tablet, Refills: 0    QUEtiapine (SEROQUEL) 50 MG tablet Take 50 mg by mouth daily as  needed (for agitation).    sertraline (ZOLOFT) 100 MG tablet Take 100 mg by mouth daily.    traZODone (DESYREL) 50 MG tablet Take 50 mg by mouth at bedtime.  zinc oxide 20 % ointment Apply topically as needed for irritation. Qty: 56.7 g, Refills: 0    aspirin EC 81 MG EC tablet Take 1 tablet (81 mg total) by mouth daily. Qty: 30 tablet, Refills: 0    docusate sodium (COLACE) 100 MG capsule Take 1 capsule (100 mg total) by mouth 2 (two) times daily. Qty: 10 capsule, Refills: 0           Relevant Imaging Results:  Relevant Lab Results:   Additional Information  Pt will have [facility] following for services at the facility.   Darden Dates, LCSW

## 2015-12-17 NOTE — Discharge Summary (Signed)
[facility] [place] NAME: [name]    MR#:  [id number]  DATE OF BIRTH:  1928-11-09  DATE OF ADMISSION:  12/12/2015 ADMITTING PHYSICIAN: Max Sane, MD  DATE OF DISCHARGE: 12/17/2015  PRIMARY CARE PHYSICIAN: Mathews Argyle, MD    ADMISSION DIAGNOSIS:  Weakness [R53.1] Healthcare-associated pneumonia [J18.9] Gastrointestinal hemorrhage with melena [K92.1] Vomiting without nausea, intractability of vomiting not specified, unspecified vomiting type [R11.11]  DISCHARGE DIAGNOSIS:  Principal Problem:   GI bleed Active Problems:   Anemia associated with acute blood loss   Persistent pneumonia   Vomiting alone  Acute CVA SECONDARY DIAGNOSIS:   Past Medical History  Diagnosis Date  . Cancer ([place])   . Heart murmur   . Breast cancer ([place])     h/o  . CVA (cerebral infarction)   . GI bleed   . PNA (pneumonia)   . Dementia     HOSPITAL COURSE:   82 year old female with a history of CVA and recent GI bleed who presents again with a GI bleed.  1. GI bleed: Patient had a recent GI bleed about a month ago and was not deemed a candidate for colonoscopy due to her inability to drink the prep and history of dementia. She had a bleeding scan at that time which did show bleeding in the sigmoid colon. Patient has been seen by GI during this hospitalization who also feels that at this point patient is not a candidate for colonoscopy. Her hemoglobin remained stable. If she were to continue to have bleeding then a GI bleeding scan to localize the site would be preferable.   2. Recent pneumonia: It appears by x-ray this may be aspiration. Speech recommends Dysphagia 2 w/ thin liquids; aspiration precautions; feeding support and encouragement - monitoring at meals  Medication Administration: Whole meds with puree. She will be on Levaquin at discharge.  3. Dementia: Continue Zoloft, Seroquel, donepezil and Namenda.  4.. Acute  nonhemorrhagic left. Cerebellar infarct and punctate bilateral occipital cortex infarcts: This is likely embolic in nature. Due to the patient's dementia and GI bleed a TEE would not be of benefit as she will not be able to tolerate the procedure and cannot take full dose anticoagulation due to a cold GI bleed. She is on a baby aspirin and statin. Her hemoglobin was being monitored closely and has been stable. I discussed side effects, benefits and risks with the patient's family who is in agreement to start a baby aspirin. . Patient does not need a Doppler as this was done early in December which showed no evidence of stenosis. She also had a 2-D echocardiogram in early December which shows a normal ejection fraction with normal right heart pressures and therefore does not need a bubble echo.Neurology consult appreciated.   SHE NEEDS PALLIATIVE CARE CONSULT AT [place] PER REQUEST OF FAMILY  DISCHARGE CONDITIONS AND DIET:  Stable condition DYSPHAGIA 2  CONSULTS OBTAINED:  Treatment Team:  Lucilla Lame, MD Alexis Goodell, MD  DRUG ALLERGIES:   Allergies  Allergen Reactions  . Sulfa Antibiotics Hives    DISCHARGE MEDICATIONS:   Current Discharge Medication List    START taking these medications   Details  levofloxacin (LEVAQUIN) 750 MG tablet Take 1 tablet (750 mg total) by mouth daily. Qty: 2 tablet, Refills: 0  stop date on 12/19/2015       CONTINUE these medications which have NOT CHANGED   Details  cetirizine (ZYRTEC) 10 MG tablet Take 10  mg by mouth daily as needed for allergies.    donepezil (ARICEPT) 10 MG tablet Take 10 mg by mouth at bedtime.    feeding supplement, ENSURE ENLIVE, (ENSURE ENLIVE) LIQD Take 237 mLs by mouth 3 (three) times daily with meals. Qty: 237 mL, Refills: 12    fluticasone (FLONASE) 50 MCG/ACT nasal spray Place 1 spray into both nostrils daily.    memantine (NAMENDA XR) 28 MG CP24 24 hr capsule Take 28 mg by mouth daily.    Menthol-Zinc  Oxide (RISAMINE) 0.44-20.625 % OINT Apply 1 application topically as needed (for irritation).     metoprolol tartrate (LOPRESSOR) 25 MG tablet Take 25 mg by mouth 2 (two) times daily.    pantoprazole (PROTONIX) 40 MG tablet Take 1 tablet (40 mg total) by mouth daily. Qty: 30 tablet, Refills: 0    pravastatin (PRAVACHOL) 40 MG tablet Take 1 tablet (40 mg total) by mouth daily at 6 PM. Qty: 30 tablet, Refills: 0    QUEtiapine (SEROQUEL) 50 MG tablet Take 50 mg by mouth  daily as needed (for agitation).    sertraline (ZOLOFT) 100 MG tablet Take 100 mg by mouth daily.    traZODone (DESYREL) 50 MG tablet Take 50 mg by mouth at bedtime.    zinc oxide 20 % ointment Apply topically as needed for irritation. Qty: 56.7 g, Refills: 0    aspirin EC 81 MG EC tablet Take 1 tablet (81 mg total) by mouth daily. Qty: 30 tablet, Refills: 0    docusate sodium (COLACE) 100 MG capsule Take 1 capsule (100 mg total) by mouth 2 (two) times daily. Qty: 10 capsule, Refills: 0              Today   CHIEF COMPLAINT:  Patient is doing well no issues overnight Patient ate supper last night  and had bath    VITAL SIGNS:  Blood pressure 146/68, pulse 71, temperature 97.3 F (36.3 C), temperature source Oral, resp. rate 17, height 5\' 6"  (1.676 m), weight 58.469 kg (128 lb 14.4 oz), SpO2 100 %.   REVIEW OF SYSTEMS:  Review of Systems  Constitutional: Negative for fever, chills and malaise/fatigue.  HENT: Negative for sore throat.   Eyes: Negative for blurred vision.  Respiratory: Negative for cough, hemoptysis, shortness of breath and wheezing.   Cardiovascular: Negative for chest pain, palpitations and leg swelling.  Gastrointestinal: Negative for nausea, vomiting, abdominal pain, diarrhea and blood in stool.  Genitourinary: Negative for dysuria.  Musculoskeletal: Negative for back pain.  Neurological: Negative for dizziness, tremors and headaches.  Endo/Heme/Allergies: Does not bruise/bleed  easily.     PHYSICAL EXAMINATION:  GENERAL:  82 y.o.-year-old patient lying in the bed with no acute distress.  NECK:  Supple, no jugular venous distention. No thyroid enlargement, no tenderness.  LUNGS: Normal breath sounds bilaterally, no wheezing, rales,rhonchi  No use of accessory muscles of respiration.  CARDIOVASCULAR: S1, S2 normal. No murmurs, rubs, or gallops.  ABDOMEN: Soft, non-tender, non-distended. Bowel sounds present. No organomegaly or mass.  EXTREMITIES: No pedal edema, cyanosis, or clubbing.  PSYCHIATRIC: The patient is alert and oriented x name  SKIN: No obvious rash, lesion, or ulcer.   DATA REVIEW:   CBC  Recent Labs Lab 12/17/15 0657  WBC 6.9  HGB 9.4*  HCT 28.9*  PLT 120*    Chemistries   Recent Labs Lab 12/13/15 0437  12/16/15 0549  NA 142  < > 138  K 3.7  < > 4.4  CL 110  < >  108  CO2 24  < > 25  GLUCOSE 96  < > 99  BUN 7  < > <5*  CREATININE 0.77  < > 0.86  CALCIUM 9.0  < > 8.8*  AST 13*  --   --   ALT 9*  --   --   ALKPHOS 57  --   --   BILITOT 1.3*  --   --   < > = values in this interval not displayed.  Cardiac Enzymes  Recent Labs Lab 12/12/15 0949  TROPONINI 0.05*    Microbiology Results  @MICRORSLT48 @  RADIOLOGY:  No results found.   Stable for discharge   Patient should follow up with PCP in 1 week  CODE STATUS:     Code Status Orders        Start     Ordered   12/12/15 1402  Do not attempt resuscitation (DNR)   Continuous    Question Answer Comment  In the event of cardiac or respiratory ARREST Do not call a "code blue"   In the event of cardiac or respiratory ARREST Do not perform Intubation, CPR, defibrillation or ACLS   In the event of cardiac or respiratory ARREST Use medication by any route, position, wound care, and other measures to relive pain and suffering. May use oxygen, suction and manual treatment of airway obstruction as needed for comfort.      12/12/15 1401    Advance Directive  Documentation        Most Recent Value   Type of Advance Directive  Healthcare Power of Attorney, Living will   Pre-existing out of facility DNR order (yellow form or pink MOST form)     "MOST" Form in Place?        TOTAL TIME TAKING CARE OF THIS PATIENT: 35 minutes.    Note: This dictation was prepared with Dragon dictation along with smaller phrase technology. Any transcriptional errors that result from this process are unintentional.  Nyhla Mountjoy M.D on 12/17/2015 at 8:05 AM  Between 7am to 6pm - Pager - [phone] After 6pm go to [email or web address] - password [email or web address]  [facility]  Office  [phone]  CC: Primary care physician; Mathews Argyle, MD

## 2015-12-17 NOTE — Progress Notes (Addendum)
Patient to be discharged to [facility] IV removed.  EMS to transport patient.

## 2015-12-17 NOTE — Progress Notes (Signed)
Physical Therapy Treatment Patient Details Name: SHANTY SICA MRN: [id number] DOB: Jan 30, 1928 Today's Date: 12/17/2015    History of Present Illness [name] is a 82 y.o. female has a past medical history significant for CVA, recent pneumonia, and recent GI bleed(presumably jeujunal) now with recurrent N/V with dark blood per rectum. Hgb=7.9 in ER. Patient is extremely weak and is now admitted. Still c/o SOB and cough    PT Comments    Limited progress towards goals this session. Family in room to help with participation, however pt very resistant to participate. Pt resistant to all mobility, only able to roll to B sides. Limited there-ex performed. Pt is not appropriate for SNF level at this time as she is unable to consistently participate. Family agreeable to transition to LTC.  Follow Up Recommendations   (long term care)     Equipment Recommendations  Hospital bed    Recommendations for Other Services       Precautions / Restrictions Precautions Precautions: Fall Restrictions Weight Bearing Restrictions: No    Mobility  Bed Mobility Overal bed mobility: Needs Assistance;+ 2 for safety/equipment Bed Mobility: Rolling           General bed mobility comments: Attempted rolling as pt on L side. Max assist required for rolling to R side with B LE disassociated from trunk. Pt actively resists rolling demonstrating core strength. Once in supine positiong, pt agreeable to limited ther-ex  Transfers                 General transfer comment: not safe at this time as pt actively resisting movement  Ambulation/Gait                 Stairs            Wheelchair Mobility    Modified Rankin (Stroke Patients Only)       Balance                                    Cognition Arousal/Alertness: Lethargic Behavior During Therapy: Restless Overall Cognitive Status: History of cognitive impairments - at baseline                      Exercises Other Exercises Other Exercises: Pt able to perform 2 reps of rolling to B sides as well as AAROM ther-ex includin B heel slides with pt able to perform flexion, however extension is purely passive. Pt also able to perform SLRs with active extension and passive flexion. Lastly, pt able to perform B UE elbow active flexion with passive extension. All ther-ex x 10 reps with increased time required for completion.    General Comments        Pertinent Vitals/Pain Pain Assessment: No/denies pain    Home Living                      Prior Function            PT Goals (current goals can now be found in the care plan section) Acute Rehab PT Goals Patient Stated Goal: none stated PT Goal Formulation: With family Time For Goal Achievement: 12/29/15 Potential to Achieve Goals: Fair Progress towards PT goals: Not progressing toward goals - comment (limited ability to participate)    Frequency  7X/week    PT Plan Frequency needs to be updated    Co-evaluation  End of Session   Activity Tolerance: No increased pain Patient left: in bed;with bed alarm set;with family/visitor present     Time: 0905-0920 PT Time Calculation (min) (ACUTE ONLY): 15 min  Charges:  $Therapeutic Exercise: 8-22 mins                    G Codes:      Hailey Miles 12/18/15, 10:44 AM  Greggory Stallion, PT, DPT [phone]

## 2015-12-17 NOTE — Progress Notes (Signed)
Checked to see if pre-authorization would be needed for non-emergent EMS transport. Per [employer] benefits obtained online through [employer], patient has a [employer] account planner PPO policy or AARP Medicare Complete Choice PPO policy.  Medicare  Medicare PPO plans do not require pre-auth for non-emergent ground transports using service codes [phone] or [phone].

## 2015-12-17 NOTE — Plan of Care (Signed)
Problem: Tissue Perfusion: Goal: Cerebral tissue perfusion will improve Outcome: Progressing Pt alert to self. No c/o pain nor distress noted. On iv fluids and iv antibiotics. On isolation for MRSA in the nares. Possible d/c today. Continue to monitor.

## 2015-12-18 ENCOUNTER — Encounter: Payer: Self-pay | Admitting: Emergency Medicine

## 2015-12-18 ENCOUNTER — Emergency Department
Admission: EM | Admit: 2015-12-18 | Discharge: 2015-12-19 | Disposition: A | Discharge status: Home / Self Care | Payer: Medicare Other | Attending: Emergency Medicine | Admitting: Emergency Medicine

## 2015-12-18 ENCOUNTER — Emergency Department: Payer: Medicare Other

## 2015-12-18 DIAGNOSIS — G459 Transient cerebral ischemic attack, unspecified: Secondary | ICD-10-CM | POA: Insufficient documentation

## 2015-12-18 DIAGNOSIS — Z79899 Other long term (current) drug therapy: Secondary | ICD-10-CM | POA: Diagnosis not present

## 2015-12-18 DIAGNOSIS — K922 Gastrointestinal hemorrhage, unspecified: Secondary | ICD-10-CM | POA: Insufficient documentation

## 2015-12-18 DIAGNOSIS — Z7982 Long term (current) use of aspirin: Secondary | ICD-10-CM | POA: Diagnosis not present

## 2015-12-18 DIAGNOSIS — F0390 Unspecified dementia without behavioral disturbance: Secondary | ICD-10-CM | POA: Insufficient documentation

## 2015-12-18 DIAGNOSIS — R4182 Altered mental status, unspecified: Secondary | ICD-10-CM | POA: Diagnosis present

## 2015-12-18 DIAGNOSIS — Z792 Long term (current) use of antibiotics: Secondary | ICD-10-CM | POA: Insufficient documentation

## 2015-12-18 LAB — CBC WITH DIFFERENTIAL/PLATELET
BASOS PCT: 1 %
Basophils Absolute: 0.1 10*3/uL (ref 0–0.1)
EOS ABS: 0 10*3/uL (ref 0–0.7)
Eosinophils Relative: 1 %
HCT: 31.3 % — ABNORMAL LOW (ref 35.0–47.0)
HEMOGLOBIN: 10 g/dL — AB (ref 12.0–16.0)
Lymphocytes Relative: 35 %
Lymphs Abs: 2.6 10*3/uL (ref 1.0–3.6)
MCH: 25.7 pg — ABNORMAL LOW (ref 26.0–34.0)
MCHC: 31.9 g/dL — ABNORMAL LOW (ref 32.0–36.0)
MCV: 80.5 fL (ref 80.0–100.0)
Monocytes Absolute: 1.8 10*3/uL — ABNORMAL HIGH (ref 0.2–0.9)
Monocytes Relative: 24 %
NEUTROS PCT: 39 %
Neutro Abs: 3 10*3/uL (ref 1.4–6.5)
Platelets: 151 10*3/uL (ref 150–440)
RBC: 3.89 MIL/uL (ref 3.80–5.20)
RDW: 16.6 % — ABNORMAL HIGH (ref 11.5–14.5)
WBC: 7.6 10*3/uL (ref 3.6–11.0)

## 2015-12-18 LAB — COMPREHENSIVE METABOLIC PANEL
ALBUMIN: 3.6 g/dL (ref 3.5–5.0)
ALK PHOS: 66 U/L (ref 38–126)
ALT: 11 U/L — AB (ref 14–54)
AST: 20 U/L (ref 15–41)
Anion gap: 10 (ref 5–15)
BUN: 11 mg/dL (ref 6–20)
CALCIUM: 9 mg/dL (ref 8.9–10.3)
CO2: 24 mmol/L (ref 22–32)
CREATININE: 1.03 mg/dL — AB (ref 0.44–1.00)
Chloride: 104 mmol/L (ref 101–111)
GFR calc Af Amer: 56 mL/min — ABNORMAL LOW (ref >60)
GFR calc non Af Amer: 48 mL/min — ABNORMAL LOW (ref >60)
GLUCOSE: 172 mg/dL — AB (ref 65–99)
Potassium: 3.8 mmol/L (ref 3.5–5.1)
SODIUM: 138 mmol/L (ref 135–145)
Total Bilirubin: 0.6 mg/dL (ref 0.3–1.2)
Total Protein: 7.6 g/dL (ref 6.5–8.1)

## 2015-12-18 LAB — TROPONIN I: Troponin I: 0.03 ng/mL (ref <0.031)

## 2015-12-18 LAB — URINALYSIS COMPLETE WITH MICROSCOPIC (ARMC ONLY)
BILIRUBIN URINE: NEGATIVE
Bacteria, UA: NONE SEEN
Glucose, UA: NEGATIVE mg/dL
Hgb urine dipstick: NEGATIVE
KETONES UR: NEGATIVE mg/dL
Leukocytes, UA: NEGATIVE
NITRITE: NEGATIVE
Protein, ur: NEGATIVE mg/dL
RBC / HPF: NONE SEEN RBC/hpf (ref 0–5)
Specific Gravity, Urine: 1.015 (ref 1.005–1.030)
Squamous Epithelial / LPF: NONE SEEN
pH: 6 (ref 5.0–8.0)

## 2015-12-18 NOTE — ED Notes (Signed)
Patient brought in from [place] for decreased LOC on arrival to ED patient is A & O to her baseline.

## 2015-12-18 NOTE — ED Provider Notes (Signed)
[facility] Emergency Department Provider Note  ____________________________________________  Time seen: Seen upon arrival to the emergency department  I have reviewed the triage vital signs and the nursing notes.   HISTORY  Chief Complaint Altered Mental Status    HPI [name] is a 82 y.o. female with a history of CVA as well as dementia who is presenting today with a 10 minute episode of delirium. Per EMS the patient was in and out of consciousness for about 10 minutes. At this time she has regained consciousness and is acting at her baseline. She is oriented to her name and is able to answer some basic simple questions. She is denying any pain at this time.Was discharged just yesterday from the hospital with a diagnosis of gastrointestinal bleeding. Has not had any further gastrointestinal bleeding per EMS at the nursing home. No weakness noted prior to arrival.   Past Medical History  Diagnosis Date  . Cancer ([place])   . Heart murmur   . Breast cancer ([place])     h/o  . CVA (cerebral infarction)   . GI bleed   . PNA (pneumonia)   . Dementia     Patient Active Problem List   Diagnosis Date Noted  . Vomiting alone   . GI bleed 12/12/2015  . Anemia associated with acute blood loss 12/12/2015  . Persistent pneumonia 12/12/2015  . Fall 11/23/2015  . CVA (cerebral infarction) 11/08/2015  . Encephalopathy 11/07/2015    Past Surgical History  Procedure Laterality Date  . Mastectomy  Rt side    1998  . Bladder tack    . Rotator cuff repair Right     Current Outpatient Rx  Name  Route  Sig  Dispense  Refill  . cetirizine (ZYRTEC) 10 MG tablet   Oral   Take 10 mg by mouth daily as needed for allergies.         Marland Kitchen donepezil (ARICEPT) 10 MG tablet   Oral   Take 10 mg by mouth at bedtime.         . feeding supplement, ENSURE ENLIVE, (ENSURE ENLIVE) LIQD   Oral   Take 237 mLs by mouth 3 (three) times daily with meals.   237 mL   12    . fluticasone (FLONASE) 50 MCG/ACT nasal spray   Each Nare   Place 1 spray into both nostrils daily.         Marland Kitchen levofloxacin (LEVAQUIN) 750 MG tablet   Oral   Take 1 tablet (750 mg total) by mouth daily. Patient taking differently: Take 750 mg by mouth daily. To take 12/18/15-12/23/15   5 tablet   0   . memantine (NAMENDA XR) 28 MG CP24 24 hr capsule   Oral   Take 28 mg by mouth daily.         . Menthol-Zinc Oxide (RISAMINE) 0.44-20.625 % OINT   Apply externally   Apply 1 application topically as needed (for irritation).          . metoprolol tartrate (LOPRESSOR) 25 MG tablet   Oral   Take 25 mg by mouth 2 (two) times daily.         . pantoprazole (PROTONIX) 40 MG tablet   Oral   Take 1 tablet (40 mg total) by mouth daily.   30 tablet   0   . pravastatin (PRAVACHOL) 80 MG tablet   Oral   Take 1 tablet (80 mg total) by mouth daily at 6 PM.  30 tablet   0   . QUEtiapine (SEROQUEL) 50 MG tablet   Oral   Take 1 tablet (50 mg total) by mouth 2 (two) times daily as needed (for agitation).   30 tablet   0   . sertraline (ZOLOFT) 100 MG tablet   Oral   Take 100 mg by mouth daily.         . traZODone (DESYREL) 50 MG tablet   Oral   Take 50 mg by mouth at bedtime.         Marland Kitchen zinc oxide 20 % ointment   Topical   Apply topically as needed for irritation.   56.7 g   0   . aspirin EC 81 MG EC tablet   Oral   Take 1 tablet (81 mg total) by mouth daily. Patient not taking: Reported on 11/23/2015   30 tablet   0   . docusate sodium (COLACE) 100 MG capsule   Oral   Take 1 capsule (100 mg total) by mouth 2 (two) times daily. Patient not taking: Reported on 11/23/2015   10 capsule   0     Allergies Sulfa antibiotics  Family History  Problem Relation Age of Onset  . Leukemia Sister   . CAD Father     Social History Social History  Substance Use Topics  . Smoking status: Never Smoker   . Smokeless tobacco: None  . Alcohol Use: No    Review  of Systems Constitutional: No fever/chills Eyes: No visual changes. ENT: No sore throat. Cardiovascular: Denies chest pain. Respiratory: Denies shortness of breath. Gastrointestinal: No abdominal pain.  No nausea, no vomiting.  No diarrhea.  No constipation. Genitourinary: Negative for dysuria. Musculoskeletal: Negative for back pain. Skin: Negative for rash. Neurological: Negative for headaches, focal weakness or numbness.  10-point ROS otherwise negative. However, the exam is limited by the patient's dementia.  ____________________________________________   PHYSICAL EXAM:  VITAL SIGNS: ED Triage Vitals  Enc Vitals Group     BP 12/18/15 2115 117/75 mmHg     Pulse Rate 12/18/15 2111 78     Resp --      Temp 12/18/15 2111 98.4 F (36.9 C)     Temp Source 12/18/15 2111 Oral     SpO2 12/18/15 2111 98 %     Weight 12/18/15 2111 131 lb (59.421 kg)     Height 12/18/15 2111 5' (1.524 m)     Head Cir --      Peak Flow --      Pain Score --      Pain Loc --      Pain Edu? --      Excl. in [place]? --     Constitutional: Alert and oriented. Well appearing and in no acute distress. Eyes: Conjunctivae are normal. PERRL. EOMI. Head: Atraumatic. Nose: No congestion/rhinnorhea. Mouth/Throat: Mucous membranes are moist.   Neck: No stridor.   Cardiovascular: Normal rate, regular rhythm. Grossly normal heart sounds.  Good peripheral circulation. Respiratory: Normal respiratory effort.  No retractions. Lungs CTAB. Gastrointestinal: Soft and nontender. No distention. No abdominal bruits. No CVA tenderness. Rectal exam with melanotic stool which is strongly heme positive. Musculoskeletal: No lower extremity tenderness nor edema.  No joint effusions. Neurologic:  Normal speech and language. No gross focal neurologic deficits are appreciated.  Skin:  Skin is warm, dry and intact. No rash noted. Psychiatric: Mood and affect are normal. Speech and behavior are  normal.  ____________________________________________   LABS (all labs  ordered are listed, but only abnormal results are displayed)  Labs Reviewed  CBC WITH DIFFERENTIAL/PLATELET - Abnormal; Notable for the following:    Hemoglobin 10.0 (*)    HCT 31.3 (*)    MCH 25.7 (*)    MCHC 31.9 (*)    RDW 16.6 (*)    Monocytes Absolute 1.8 (*)    All other components within normal limits  COMPREHENSIVE METABOLIC PANEL - Abnormal; Notable for the following:    Glucose, Bld 172 (*)    Creatinine, Ser 1.03 (*)    ALT 11 (*)    GFR calc non Af Amer 48 (*)    GFR calc Af Amer 56 (*)    All other components within normal limits  URINALYSIS COMPLETEWITH MICROSCOPIC ([facility] ONLY) - Abnormal; Notable for the following:    Color, Urine YELLOW (*)    APPearance CLEAR (*)    All other components within normal limits  TROPONIN I   ____________________________________________  EKG  ED ECG REPORT I, Doran Stabler, the attending physician, personally viewed and interpreted this ECG.   Date: 12/18/2015  EKG Time: 2112  Rate: 75  Rhythm: normal EKG, normal sinus rhythm  Axis: Normal axis  Intervals:none  ST&T Change: No ST segment elevation or depression. No abnormal T-wave inversion.  ____________________________________________  RADIOLOGY  Mild vascular congestion noted on the chest x-ray. Lungs remain grossly clear. CT scan without any acute intracranial hemorrhage. Left superior cerebellar acute subacute infarct as seen on the recent MRI. No acute intracranial hemorrhage. ____________________________________________   PROCEDURES    ____________________________________________   INITIAL IMPRESSION / ASSESSMENT AND PLAN / ED COURSE  Pertinent labs & imaging results that were available during my care of the patient were reviewed by me and considered in my medical decision making (see chart for details).  ----------------------------------------- 12:10 AM on  12/19/2015 -----------------------------------------  Patient is resting comfortably at this time without any distress and remains at her baseline mental status. The patient's 3 daughters are now the bedside and said that this presentation was similar to the last time she had a stroke during her last hospitalization. They were able to give more details of the episode this evening.  They said that the patient started to become listless while moving her bowels. After she was removed from the commode she was altered for about 10 minutes before regaining her baseline mental status. They said that she was flailing her arms during this period before returning to baseline. They said that there was also blood noted in her stool when she was moving her bowels this evening.  We discussed possible causes for the patient's episode this evening. Syncope could be one especially because the patient was moving her bowels. However, she could be having another stroke because of the similar presentation to her last stroke. She is also not on any anticoagulation at this time because of persistent GI bleeding.    The daughters at this point would like to just "keep the patient comfortable." I did clarify what this meant and they said that they do not want any further medical intervention such as attempts at anticoagulation or blood transfusion. We discussed that they are very difficult position because the patient is having strokes which would require blood thinning medication but also gastric intestinal bleeding which could be worsened with blood thinning medications. They have already initiated talks with palliative care at the [place] for the patient lives in the memory unit. I did discuss the case with  Horris Latino, the hospice worker on call. She says that there are no available beds at the hospice house. I did have this discussion with the daughters and they said that they would rather that the patient be discharged back  to the [place]. She is in no distress or showing any signs of pain at this time. Also, the patient lives there with her husband and they feel that the patient would be best served there. They will continue working to secure adequate palliative care for the patient. They understand that without further treatment at this time the patient will likely go on to die. He is unclear how long this will take. They did ask me for timeline but I said that I was not secure giving him an estimate.  ____________________________________________   FINAL CLINICAL IMPRESSION(S) / ED DIAGNOSES  TIA. GI bleed.     Orbie Pyo, MD 12/19/15 [phone]

## 2015-12-18 NOTE — ED Notes (Signed)
MD at bedside. 

## 2015-12-18 NOTE — ED Notes (Signed)
Patient brought in by [facility] from [place] for decreased LOC, patient was discharged from [facility] yesterday for a GI bleed.

## 2015-12-18 NOTE — ED Notes (Signed)
Per patient daughter, she was at [place] visiting patient and she had several episodes of diarrhea, staff reported that there was blood in patients stool. While she was using the bathroom patient turned very pale and went limp, this lasted for about 15 minutes.

## 2015-12-19 NOTE — ED Notes (Signed)
This nurse spoke with [place] to inform of patient's discharge and see if patient needed transportation.

## 2016-02-03 DEATH — deceased

## 2016-09-10 IMAGING — CR DG CHEST 2V
2 series · 2 of 2 positions shown · non-contrast
Comparison: 11/10/2015, 11/09/2015

CLINICAL DATA: Subsequent evaluation for cough

EXAM:
CHEST  2 VIEW

[chest lat]
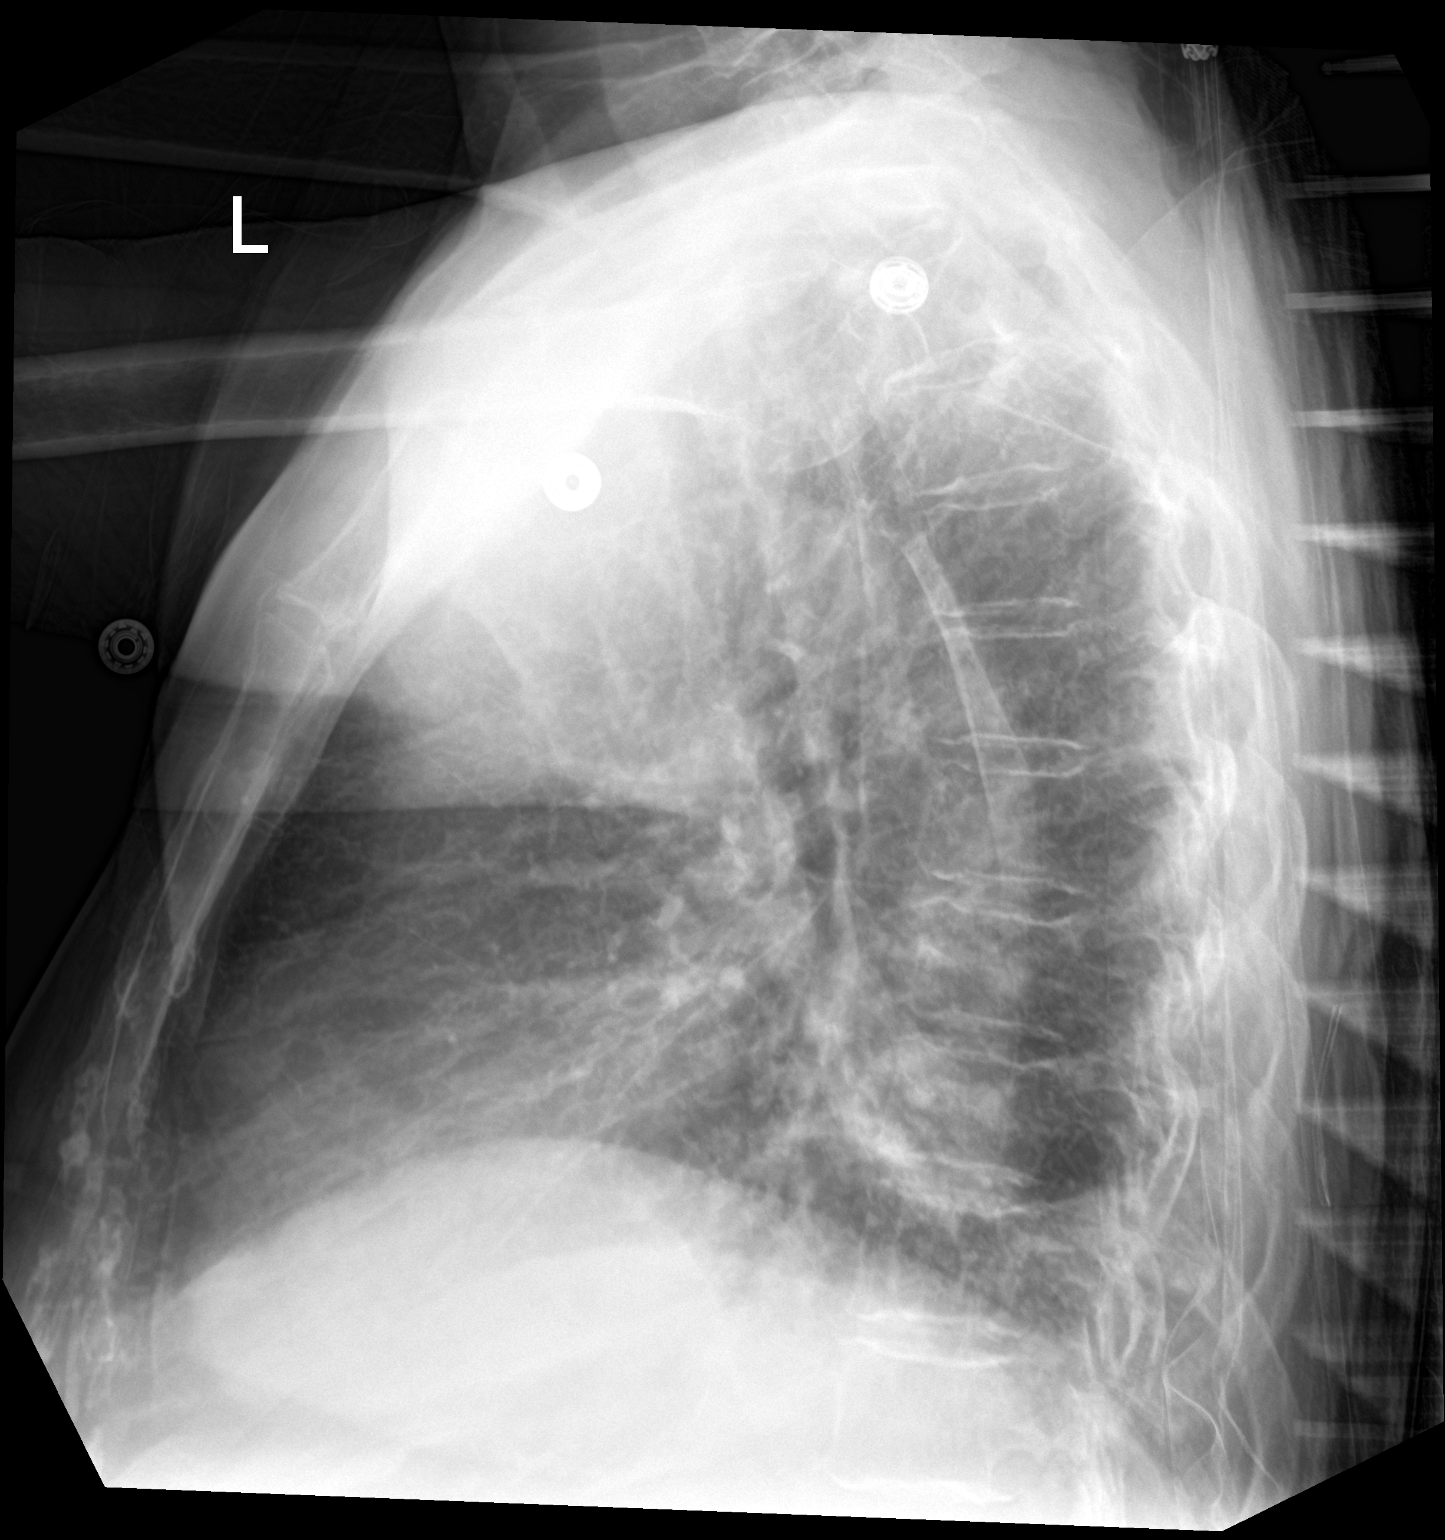

[chest ap]
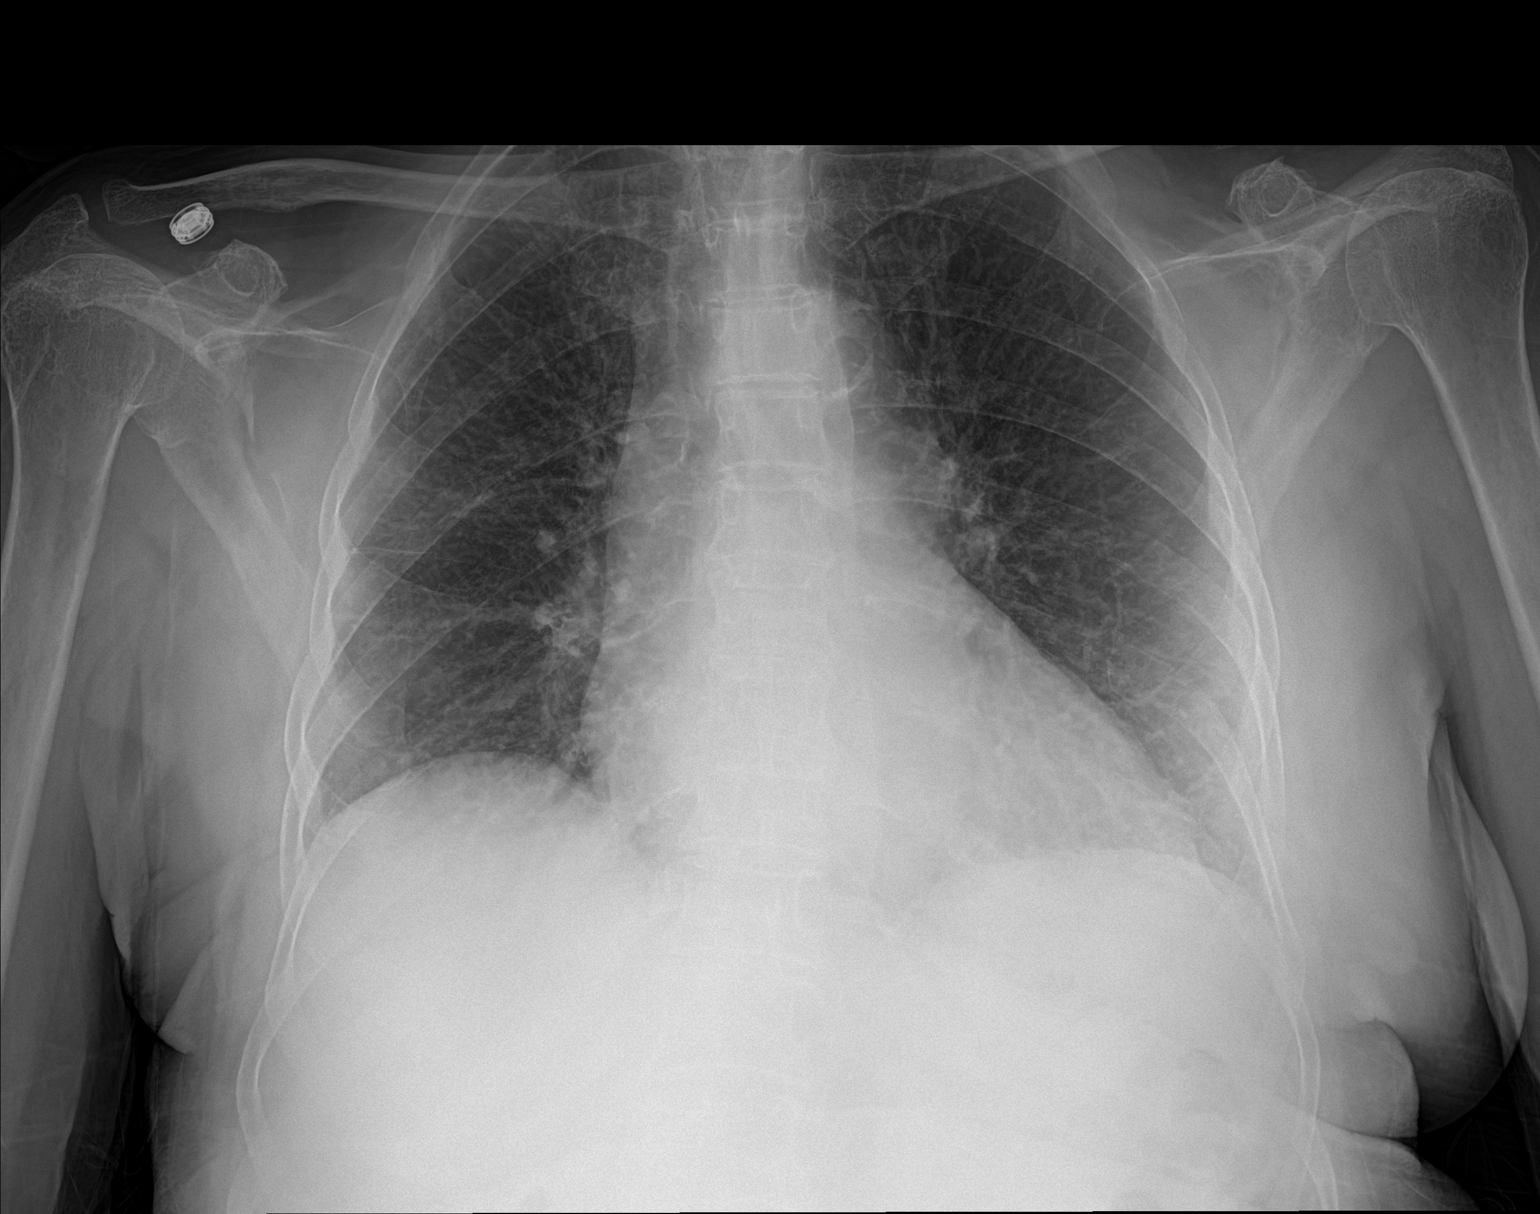

[2 of 2 positions shown; findings below may reference images not displayed]

FINDINGS: Stable mild cardiac enlargement. Mild diffuse interstitial
prominence, stable from prior studies. No consolidation or effusion.
IMPRESSION: No active cardiopulmonary disease.
